# Patient Record
Sex: Female | Born: 1990 | Race: Black or African American | Hispanic: No | Smoking: Never smoker
Health system: Southern US, Community
[De-identification: ages and names within clinical notes are randomized; demographics above are authoritative.]

## PROBLEM LIST (undated history)

## (undated) DIAGNOSIS — G4730 Sleep apnea, unspecified: Secondary | ICD-10-CM

## (undated) DIAGNOSIS — K219 Gastro-esophageal reflux disease without esophagitis: Secondary | ICD-10-CM

## (undated) DIAGNOSIS — K7010 Alcoholic hepatitis without ascites: Secondary | ICD-10-CM

## (undated) DIAGNOSIS — E860 Dehydration: Secondary | ICD-10-CM

## (undated) DIAGNOSIS — F1010 Alcohol abuse, uncomplicated: Secondary | ICD-10-CM

## (undated) DIAGNOSIS — I509 Heart failure, unspecified: Secondary | ICD-10-CM

## (undated) HISTORY — PX: NO PAST SURGERIES: SHX2092

---

## 2013-09-11 ENCOUNTER — Emergency Department (HOSPITAL_BASED_OUTPATIENT_CLINIC_OR_DEPARTMENT_OTHER)
Admission: EM | Admit: 2013-09-11 | Discharge: 2013-09-11 | Disposition: A | Discharge status: Home / Self Care | Payer: Self-pay | Attending: Emergency Medicine | Admitting: Emergency Medicine

## 2013-09-11 ENCOUNTER — Encounter (HOSPITAL_BASED_OUTPATIENT_CLINIC_OR_DEPARTMENT_OTHER): Payer: Self-pay | Admitting: Emergency Medicine

## 2013-09-11 DIAGNOSIS — R209 Unspecified disturbances of skin sensation: Secondary | ICD-10-CM | POA: Insufficient documentation

## 2013-09-11 DIAGNOSIS — Z3202 Encounter for pregnancy test, result negative: Secondary | ICD-10-CM | POA: Insufficient documentation

## 2013-09-11 DIAGNOSIS — R55 Syncope and collapse: Secondary | ICD-10-CM | POA: Insufficient documentation

## 2013-09-11 LAB — BASIC METABOLIC PANEL
ANION GAP: 21 — AB (ref 5–15)
BUN: 5 mg/dL — ABNORMAL LOW (ref 6–23)
CHLORIDE: 98 meq/L (ref 96–112)
CO2: 20 meq/L (ref 19–32)
Calcium: 9.3 mg/dL (ref 8.4–10.5)
Creatinine, Ser: 0.6 mg/dL (ref 0.50–1.10)
GFR calc Af Amer: >90 mL/min (ref >90)
GFR calc non Af Amer: >90 mL/min (ref >90)
Glucose, Bld: 84 mg/dL (ref 70–99)
POTASSIUM: 3.3 meq/L — AB (ref 3.7–5.3)
SODIUM: 139 meq/L (ref 137–147)

## 2013-09-11 LAB — CBC WITH DIFFERENTIAL/PLATELET
BASOS ABS: 0 10*3/uL (ref 0.0–0.1)
Basophils Relative: 0 % (ref 0–1)
Eosinophils Absolute: 0.1 10*3/uL (ref 0.0–0.7)
Eosinophils Relative: 1 % (ref 0–5)
HCT: 36.4 % (ref 36.0–46.0)
Hemoglobin: 12.8 g/dL (ref 12.0–15.0)
LYMPHS ABS: 0.7 10*3/uL (ref 0.7–4.0)
LYMPHS PCT: 10 % — AB (ref 12–46)
MCH: 32.5 pg (ref 26.0–34.0)
MCHC: 35.2 g/dL (ref 30.0–36.0)
MCV: 92.4 fL (ref 78.0–100.0)
Monocytes Absolute: 0.8 10*3/uL (ref 0.1–1.0)
Monocytes Relative: 11 % (ref 3–12)
NEUTROS ABS: 5.2 10*3/uL (ref 1.7–7.7)
Neutrophils Relative %: 78 % — ABNORMAL HIGH (ref 43–77)
PLATELETS: 162 10*3/uL (ref 150–400)
RBC: 3.94 MIL/uL (ref 3.87–5.11)
RDW: 13.8 % (ref 11.5–15.5)
WBC: 6.7 10*3/uL (ref 4.0–10.5)

## 2013-09-11 LAB — URINALYSIS, ROUTINE W REFLEX MICROSCOPIC
BILIRUBIN URINE: NEGATIVE
Glucose, UA: NEGATIVE mg/dL
Hgb urine dipstick: NEGATIVE
Ketones, ur: 40 mg/dL — AB
Nitrite: NEGATIVE
Protein, ur: NEGATIVE mg/dL
Specific Gravity, Urine: 1.022 (ref 1.005–1.030)
UROBILINOGEN UA: 1 mg/dL (ref 0.0–1.0)
pH: 7 (ref 5.0–8.0)

## 2013-09-11 LAB — URINE MICROSCOPIC-ADD ON

## 2013-09-11 LAB — PREGNANCY, URINE: Preg Test, Ur: NEGATIVE

## 2013-09-11 NOTE — ED Notes (Signed)
Pt to room 6 in w/c, able to stand and walk to bed. Pt states she was at work and "I got scared", pt states she started to breath rapidly, her legs felt cramping, she felt lightheaded and "numb and tingly all over". Pt states she is feeling much better now.

## 2013-09-11 NOTE — ED Provider Notes (Signed)
CSN: [id number]     Arrival date & time 09/11/13  4098 History   First MD Initiated Contact with Patient 09/11/13 1010     Chief Complaint  Patient presents with  . Panic Attack     (Consider location/radiation/quality/duration/timing/severity/associated sxs/prior Treatment) HPI Comments: Patient is a 23 year old female who presents with complaints of feeling "numb all over." She went to work this morning feeling well, then suddenly developed tingling around her mouth, difficulty breathing, a tingling sensation throughout her body. She goes though she was going to pass out. This lasted for several minutes, then resolved prior to arrival. She denies any headache, palpitations. She denies any fevers or chills. She states this has never happened to her before. She denies drug or alcohol use.  The history is provided by the patient.    History reviewed. No pertinent past medical history. History reviewed. No pertinent past surgical history. History reviewed. No pertinent family history. History  Substance Use Topics  . Smoking status: Never Smoker   . Smokeless tobacco: Not on file  . Alcohol Use: Not on file   OB History   Grav Para Term Preterm Abortions TAB SAB Ect Mult Living                 Review of Systems  All other systems reviewed and are negative.     Allergies  Review of patient's allergies indicates no known allergies.  Home Medications   Prior to Admission medications   Not on File   BP 144/77  Pulse 113  Temp(Src) 98.3 F (36.8 C) (Oral)  Resp 28  Ht  (1.702 m)  Wt 140 lb (63.504 kg)  BMI 21.92 kg/m2  SpO2 100% Physical Exam  Nursing note and vitals reviewed. Constitutional: She is oriented to person, place, and time. She appears well-developed and well-nourished. No distress.  HENT:  Head: Normocephalic and atraumatic.  Eyes: EOM are normal. Pupils are equal, round, and reactive to light.  Neck: Normal range of motion. Neck supple.   Cardiovascular: Normal rate and regular rhythm.  Exam reveals no gallop and no friction rub.   No murmur heard. Pulmonary/Chest: Effort normal and breath sounds normal. No respiratory distress. She has no wheezes.  Abdominal: Soft. Bowel sounds are normal. She exhibits no distension. There is no tenderness.  Musculoskeletal: Normal range of motion.  Neurological: She is alert and oriented to person, place, and time. No cranial nerve deficit. She exhibits normal muscle tone. Coordination normal.  Skin: Skin is warm and dry. She is not diaphoretic.    ED Course  Procedures (including critical care time) Labs Review Labs Reviewed  CBC WITH DIFFERENTIAL  BASIC METABOLIC PANEL  PREGNANCY, URINE  URINALYSIS, ROUTINE W REFLEX MICROSCOPIC    Imaging Review No results found.   EKG Interpretation   Date/Time:  Monday September 11 2013 10:29:02 EDT Ventricular Rate:  59 PR Interval:  148 QRS Duration: 72 QT Interval:  430 QTC Calculation: 425 R Axis:   89 Text Interpretation:  Sinus bradycardia Otherwise normal ECG Confirmed by  DELOS  MD, Robi Mitter ([id number]) on 09/11/2013 10:30:22 AM      MDM   Final diagnoses:  None    Patient presents with complaints of near syncope, numbness, and tingling. Her initial presentation appeared consistent with a panic attack although she has had no prior history of this. Workup including EKG, laboratory studies, and urinalysis fails to reveal an alternate diagnosis. She is now feeling better and appears appropriate for discharge.  She understands to return if her symptoms worsen or recur.    Geoffery Lyons, MD 09/11/13 [phone]

## 2013-09-11 NOTE — Discharge Instructions (Signed)
Followup with your primary Dr. in the next week, and return to the ER if your symptoms substantially worsen or change.   Near-Syncope Near-syncope (commonly known as near fainting) is sudden weakness, dizziness, or feeling like you might pass out. During an episode of near-syncope, you may also develop pale skin, have tunnel vision, or feel sick to your stomach (nauseous). Near-syncope may occur when getting up after sitting or while standing for a long time. It is caused by a sudden decrease in blood flow to the brain. This decrease can result from various causes or triggers, most of which are not serious. However, because near-syncope can sometimes be a sign of something serious, a medical evaluation is required. The specific cause is often not determined. HOME CARE INSTRUCTIONS  Monitor your condition for any changes. The following actions may help to alleviate any discomfort you are experiencing:  Have someone stay with you until you feel stable.  Lie down right away and prop your feet up if you start feeling like you might faint. Breathe deeply and steadily. Wait until all the symptoms have passed. Most of these episodes last only a few minutes. You may feel tired for several hours.   Drink enough fluids to keep your urine clear or pale yellow.   If you are taking blood pressure or heart medicine, get up slowly when seated or lying down. Take several minutes to sit and then stand. This can reduce dizziness.  Follow up with your health care provider as directed. SEEK IMMEDIATE MEDICAL CARE IF:   You have a severe headache.   You have unusual pain in the chest, abdomen, or back.   You are bleeding from the mouth or rectum, or you have black or tarry stool.   You have an irregular or very fast heartbeat.   You have repeated fainting or have seizure-like jerking during an episode.   You faint when sitting or lying down.   You have confusion.   You have difficulty walking.    You have severe weakness.   You have vision problems.  MAKE SURE YOU:   Understand these instructions.  Will watch your condition.  Will get help right away if you are not doing well or get worse. Document Released: 01/05/2005 Document Revised: 01/10/2013 Document Reviewed: 06/10/2012 [facility] Patient Information 2015 [place], [place]. This information is not intended to replace advice given to you by your health care provider. Make sure you discuss any questions you have with your health care provider.

## 2015-10-16 ENCOUNTER — Encounter (HOSPITAL_BASED_OUTPATIENT_CLINIC_OR_DEPARTMENT_OTHER): Payer: Self-pay | Admitting: Emergency Medicine

## 2015-10-16 ENCOUNTER — Emergency Department (HOSPITAL_BASED_OUTPATIENT_CLINIC_OR_DEPARTMENT_OTHER)
Admission: EM | Admit: 2015-10-16 | Discharge: 2015-10-16 | Disposition: A | Discharge status: Home / Self Care | Payer: Self-pay | Attending: Emergency Medicine | Admitting: Emergency Medicine

## 2015-10-16 DIAGNOSIS — F1010 Alcohol abuse, uncomplicated: Secondary | ICD-10-CM | POA: Insufficient documentation

## 2015-10-16 NOTE — ED Triage Notes (Signed)
Pt tearful at triage requesting alcohol detox. Reports that she consumes approximately 4-5 shots of liquor nightly. States "I drink at night specifically to help me sleep, I have bad insomnia. When I don't sleep I have bad nightmares and anxiety. My mind goes all over the place. Pt reports that called [facility] in [place]. States " I need inpatient"

## 2015-10-16 NOTE — ED Provider Notes (Signed)
[facility] Provider Note   CSN: [id number] Arrival date & time: 10/16/15  1435     History   Chief Complaint Chief Complaint  Patient presents with  . Alcohol Problem    requesting detox    HPI Maureen Hanna is a 25 y.o. female.  HPI  Patient presents here for assistance with alcohol rehabilitation. Patient reports that she drinks every night to help her sleep otherwise she has difficulty sleeping and has nightmares. She endorses increased stress and depression but denies any SI, HI, or AVH. Patient reports already contacting several outpatient rehabilitation facilities.  She denies any other physical complaints at this time.  History reviewed. No pertinent past medical history.  There are no active problems to display for this patient.   History reviewed. No pertinent surgical history.  OB History    No data available       Home Medications    Prior to Admission medications   Not on File    Family History No family history on file.  Social History Social History  Substance Use Topics  . Smoking status: Never Smoker  . Smokeless tobacco: Never Used  . Alcohol use 3.0 oz/week    5 Shots of liquor per week     Allergies   Review of patient's allergies indicates no known allergies.   Review of Systems Review of Systems  Constitutional: Negative for chills and fever.  HENT: Negative for ear pain and sore throat.   Eyes: Negative for pain and visual disturbance.  Respiratory: Negative for cough and shortness of breath.   Cardiovascular: Negative for chest pain and palpitations.  Gastrointestinal: Negative for abdominal pain and vomiting.  Genitourinary: Negative for dysuria and hematuria.  Musculoskeletal: Negative for arthralgias and back pain.  Skin: Negative for color change and rash.  Neurological: Negative for seizures and syncope.  All other systems reviewed and are negative.    Physical Exam Updated Vital Signs BP  115/89 (BP Location: Left Arm)   Pulse 90   Temp 98.1 F (36.7 C) (Oral)   Resp 20   Ht 5\' 7"  (1.702 m)   Wt 132 lb (59.9 kg)   LMP 09/21/2015   SpO2 99%   BMI 20.67 kg/m   Physical Exam  Constitutional: She is oriented to person, place, and time. She appears well-developed and well-nourished. No distress.  HENT:  Head: Normocephalic and atraumatic.  Nose: Nose normal.  Eyes: Conjunctivae and EOM are normal. Pupils are equal, round, and reactive to light. Right eye exhibits no discharge. Left eye exhibits no discharge. No scleral icterus.  Neck: Normal range of motion. Neck supple.  Cardiovascular: Normal rate and regular rhythm.  Exam reveals no gallop and no friction rub.   No murmur heard. Pulmonary/Chest: Effort normal and breath sounds normal. No stridor. No respiratory distress. She has no rales.  Abdominal: Soft. She exhibits no distension. There is no tenderness.  Musculoskeletal: She exhibits no edema or tenderness.  Neurological: She is alert and oriented to person, place, and time.  Skin: Skin is warm and dry. No rash noted. She is not diaphoretic. No erythema.  Psychiatric: She has a normal mood and affect.  Vitals reviewed.    ED Treatments / Results  Labs (all labs ordered are listed, but only abnormal results are displayed) Labs Reviewed - No data to display  EKG  EKG Interpretation  Date/Time:  Wednesday October 16 2015 15:15:25 EDT Ventricular Rate:  70 PR Interval:    QRS Duration:  76 QT Interval:  402 QTC Calculation: 434 R Axis:   81 Text Interpretation:  Sinus arrhythmia new from prior tracing Confirmed by [facility] MD, Patricio Popwell ([id number]) on 10/16/2015 4:47:46 PM       Radiology No results found.  Procedures Procedures (including critical care time)  Medications Ordered in ED Medications - No data to display   Initial Impression / Assessment and Plan / ED Course  I have reviewed the triage vital signs and the nursing notes.  Pertinent  labs & imaging results that were available during my care of the patient were reviewed by me and considered in my medical decision making (see chart for details).  Clinical Course    Patient provided with a additional resources for rehabilitation facilities. No acute or emergent process identified during history of present illness. No indication for workup at this time.  Patient safe for discharge with strict return precautions.  Final Clinical Impressions(s) / ED Diagnoses   Final diagnoses:  Alcohol abuse   Disposition: Discharge  Condition: Good  I have discussed the results, Dx and Tx plan with the patient who expressed understanding and agree(s) with the plan. Discharge instructions discussed at great length. The patient was given strict return precautions who verbalized understanding of the instructions. No further questions at time of discharge.    There are no discharge medications for this patient.   [facility] [address] [place] [id number] [phone] Call  For help establishing care with a care provider       Nira ConnPedro Eduardo Melaina Howerton, MD 10/16/15 [phone]

## 2016-09-17 ENCOUNTER — Emergency Department (HOSPITAL_COMMUNITY): Payer: BC Managed Care – PPO

## 2016-09-17 ENCOUNTER — Encounter (HOSPITAL_BASED_OUTPATIENT_CLINIC_OR_DEPARTMENT_OTHER): Payer: Self-pay

## 2016-09-17 ENCOUNTER — Other Ambulatory Visit: Payer: Self-pay

## 2016-09-17 ENCOUNTER — Emergency Department (HOSPITAL_BASED_OUTPATIENT_CLINIC_OR_DEPARTMENT_OTHER): Payer: BC Managed Care – PPO

## 2016-09-17 ENCOUNTER — Inpatient Hospital Stay (HOSPITAL_BASED_OUTPATIENT_CLINIC_OR_DEPARTMENT_OTHER)
Admission: EM | Admit: 2016-09-17 | Discharge: 2016-09-19 | DRG: 439 | Disposition: A | Discharge status: Home / Self Care | Payer: BC Managed Care – PPO | Attending: Internal Medicine | Admitting: Internal Medicine

## 2016-09-17 DIAGNOSIS — R112 Nausea with vomiting, unspecified: Secondary | ICD-10-CM

## 2016-09-17 DIAGNOSIS — K759 Inflammatory liver disease, unspecified: Secondary | ICD-10-CM

## 2016-09-17 DIAGNOSIS — E8729 Other acidosis: Secondary | ICD-10-CM

## 2016-09-17 DIAGNOSIS — N289 Disorder of kidney and ureter, unspecified: Secondary | ICD-10-CM | POA: Diagnosis present

## 2016-09-17 DIAGNOSIS — E860 Dehydration: Secondary | ICD-10-CM

## 2016-09-17 DIAGNOSIS — E871 Hypo-osmolality and hyponatremia: Secondary | ICD-10-CM | POA: Diagnosis present

## 2016-09-17 DIAGNOSIS — F1010 Alcohol abuse, uncomplicated: Secondary | ICD-10-CM | POA: Diagnosis present

## 2016-09-17 DIAGNOSIS — K859 Acute pancreatitis without necrosis or infection, unspecified: Principal | ICD-10-CM | POA: Diagnosis present

## 2016-09-17 DIAGNOSIS — E876 Hypokalemia: Secondary | ICD-10-CM | POA: Diagnosis present

## 2016-09-17 DIAGNOSIS — R1110 Vomiting, unspecified: Secondary | ICD-10-CM

## 2016-09-17 DIAGNOSIS — E872 Acidosis, unspecified: Secondary | ICD-10-CM | POA: Diagnosis present

## 2016-09-17 DIAGNOSIS — K529 Noninfective gastroenteritis and colitis, unspecified: Secondary | ICD-10-CM | POA: Diagnosis present

## 2016-09-17 DIAGNOSIS — K7010 Alcoholic hepatitis without ascites: Secondary | ICD-10-CM | POA: Diagnosis present

## 2016-09-17 DIAGNOSIS — R748 Abnormal levels of other serum enzymes: Secondary | ICD-10-CM | POA: Diagnosis present

## 2016-09-17 DIAGNOSIS — N390 Urinary tract infection, site not specified: Secondary | ICD-10-CM | POA: Diagnosis present

## 2016-09-17 HISTORY — DX: Alcohol abuse, uncomplicated: F10.10

## 2016-09-17 LAB — URINALYSIS, ROUTINE W REFLEX MICROSCOPIC
Glucose, UA: NEGATIVE mg/dL
Ketones, ur: >80 mg/dL — AB
NITRITE: NEGATIVE
Specific Gravity, Urine: >1.03 — ABNORMAL HIGH (ref 1.005–1.030)
pH: 6 (ref 5.0–8.0)

## 2016-09-17 LAB — URINALYSIS, MICROSCOPIC (REFLEX)

## 2016-09-17 LAB — CBC WITH DIFFERENTIAL/PLATELET
Basophils Absolute: 0 10*3/uL (ref 0.0–0.1)
Basophils Relative: 0 %
Eosinophils Absolute: 0 10*3/uL (ref 0.0–0.7)
Eosinophils Relative: 0 %
HCT: 42.3 % (ref 36.0–46.0)
Hemoglobin: 15.3 g/dL — ABNORMAL HIGH (ref 12.0–15.0)
LYMPHS PCT: 9 %
Lymphs Abs: 0.7 10*3/uL (ref 0.7–4.0)
MCH: 32.8 pg (ref 26.0–34.0)
MCHC: 36.2 g/dL — AB (ref 30.0–36.0)
MCV: 90.8 fL (ref 78.0–100.0)
MONO ABS: 1.2 10*3/uL — AB (ref 0.1–1.0)
MONOS PCT: 15 %
Neutro Abs: 5.9 10*3/uL (ref 1.7–7.7)
Neutrophils Relative %: 76 %
Platelets: 140 10*3/uL — ABNORMAL LOW (ref 150–400)
RBC: 4.66 MIL/uL (ref 3.87–5.11)
RDW: 14.1 % (ref 11.5–15.5)
WBC: 7.8 10*3/uL (ref 4.0–10.5)

## 2016-09-17 LAB — COMPREHENSIVE METABOLIC PANEL
ALBUMIN: 4.8 g/dL (ref 3.5–5.0)
ALT: 102 U/L — ABNORMAL HIGH (ref 14–54)
ANION GAP: 29 — AB (ref 5–15)
AST: 353 U/L — AB (ref 15–41)
Alkaline Phosphatase: 94 U/L (ref 38–126)
BUN: 26 mg/dL — ABNORMAL HIGH (ref 6–20)
CALCIUM: 9.4 mg/dL (ref 8.9–10.3)
CO2: 12 mmol/L — AB (ref 22–32)
Chloride: 89 mmol/L — ABNORMAL LOW (ref 101–111)
Creatinine, Ser: 1.13 mg/dL — ABNORMAL HIGH (ref 0.44–1.00)
GFR calc Af Amer: >60 mL/min (ref >60)
GFR calc non Af Amer: >60 mL/min (ref >60)
GLUCOSE: 96 mg/dL (ref 65–99)
Potassium: 3.3 mmol/L — ABNORMAL LOW (ref 3.5–5.1)
SODIUM: 130 mmol/L — AB (ref 135–145)
Total Bilirubin: 3.3 mg/dL — ABNORMAL HIGH (ref 0.3–1.2)
Total Protein: 8.8 g/dL — ABNORMAL HIGH (ref 6.5–8.1)

## 2016-09-17 LAB — PREGNANCY, URINE: Preg Test, Ur: NEGATIVE

## 2016-09-17 LAB — I-STAT CG4 LACTIC ACID, ED
Lactic Acid, Venous: 1.68 mmol/L (ref 0.5–1.9)
Lactic Acid, Venous: 4.68 mmol/L (ref 0.5–1.9)

## 2016-09-17 LAB — LIPASE, BLOOD: LIPASE: 315 U/L — AB (ref 11–51)

## 2016-09-17 MED ORDER — DEXTROSE 5 % IV SOLN
1.0000 g | Freq: Once | INTRAVENOUS | Status: AC
  Administered 2016-09-17: 1 g via INTRAVENOUS
  Filled 2016-09-17: qty 10

## 2016-09-17 MED ORDER — PROMETHAZINE HCL 25 MG/ML IJ SOLN
12.5000 mg | Freq: Once | INTRAMUSCULAR | Status: AC
  Administered 2016-09-17: 12.5 mg via INTRAVENOUS
  Filled 2016-09-17: qty 1

## 2016-09-17 MED ORDER — IOPAMIDOL (ISOVUE-300) INJECTION 61%
100.0000 mL | Freq: Once | INTRAVENOUS | Status: AC | PRN
  Administered 2016-09-17: 100 mL via INTRAVENOUS

## 2016-09-17 MED ORDER — ONDANSETRON HCL 4 MG/2ML IJ SOLN
4.0000 mg | Freq: Once | INTRAMUSCULAR | Status: AC
  Administered 2016-09-17: 4 mg via INTRAVENOUS
  Filled 2016-09-17: qty 2

## 2016-09-17 MED ORDER — SODIUM CHLORIDE 0.9 % IV BOLUS (SEPSIS)
1000.0000 mL | Freq: Once | INTRAVENOUS | Status: AC
  Administered 2016-09-17: 1000 mL via INTRAVENOUS

## 2016-09-17 MED ORDER — SODIUM CHLORIDE 0.9 % IV BOLUS (SEPSIS)
2000.0000 mL | Freq: Once | INTRAVENOUS | Status: AC
  Administered 2016-09-17: 1000 mL via INTRAVENOUS

## 2016-09-17 NOTE — ED Provider Notes (Signed)
Emergency Department Provider Note   I have reviewed the triage vital signs and the nursing notes.   HISTORY  Chief Complaint Vomiting   HPI Maureen Hanna is a 26 y.o. female without significant past medical history who presents to the emergency department today secondary to 4 days of vomiting. She states that she ate at a friend's house on Sunday and then that evening started having vomiting. She thought it was just food poisoning versus viral gastroenteritis like she had had before and kind of dealt with it at home with her antibiotics. She continued with vomiting for the last 4 days total. Nonbloody nonbilious. No diarrhea. She has had some dysuria and now also is having bilateral low back pain. Never had pyelonephritis before but has had a UTI that felt similar. No other modifying or associated symptoms. Besides antiemetics is not driving for the symptoms. She states she did start her period a couple days ago but she usually does not have these types of symptoms with her period.    Past Medical History:  Diagnosis Date  . Alcohol abuse     There are no active problems to display for this patient.   History reviewed. No pertinent surgical history.    Allergies Patient has no known allergies.  No family history on file.  Social History Social History  Substance Use Topics  . Smoking status: Never Smoker  . Smokeless tobacco: Never Used  . Alcohol use Yes     Comment: weekly    Review of Systems Constitutional: No fever but has had some chills Eyes: No visual changes. ENT: No sore throat. Cardiovascular: Denies chest pain. Respiratory: Denies shortness of breath. Gastrointestinal: Mild abdominal pain.  No diarrhea.  No constipation. Genitourinary: Negative for dysuria. Musculoskeletal: Positive for back pain. Skin: Negative for rash. Neurological: Negative for headaches, focal weakness or numbness.  10-point ROS otherwise  negative.  ____________________________________________   PHYSICAL EXAM:  VITAL SIGNS: ED Triage Vitals [09/17/16 1933]  Enc Vitals Group     BP (!) 123/91     Pulse Rate (!) 148     Resp 20     Temp 98.8 F (37.1 C)     Temp Source Oral     SpO2 100 %     Weight 113 lb (51.3 kg)     Height 5\' 7"  (1.702 m)     Head Circumference      Peak Flow      Pain Score      Pain Loc      Pain Edu?      Excl. in GC?     Constitutional: Alert and oriented. Well appearing and in no acute distress. Eyes: Conjunctivae are normal. PERRL. EOMI. Head: Atraumatic. Nose: No congestion/rhinnorhea. Mouth/Throat: Mucous membranes are moist.  Oropharynx non-erythematous. Neck: No stridor.  No meningeal signs.   Cardiovascular: Tachycardia, regular rhythm. Good peripheral circulation. Grossly normal heart sounds.   Respiratory: Normal respiratory effort.  No retractions. Lungs CTAB. Gastrointestinal: mild epigastric and suprapubic tenderness. No distention.  Musculoskeletal: No lower extremity tenderness nor edema. No gross deformities of extremities. Neurologic:  Normal speech and language. No gross focal neurologic deficits are appreciated.  Skin:  Skin is warm, dry and intact. No rash noted.   ____________________________________________   LABS (all labs ordered are listed, but only abnormal results are displayed)  Labs Reviewed  COMPREHENSIVE METABOLIC PANEL - Abnormal; Notable for the following:       Result Value   Sodium 130 (*)  Potassium 3.3 (*)    Chloride 89 (*)    CO2 12 (*)    BUN 26 (*)    Creatinine, Ser 1.13 (*)    Total Protein 8.8 (*)    AST 353 (*)    ALT 102 (*)    Total Bilirubin 3.3 (*)    Anion gap 29 (*)    All other components within normal limits  CBC WITH DIFFERENTIAL/PLATELET - Abnormal; Notable for the following:    Hemoglobin 15.3 (*)    MCHC 36.2 (*)    Platelets 140 (*)    Monocytes Absolute 1.2 (*)    All other components within normal  limits  URINALYSIS, ROUTINE W REFLEX MICROSCOPIC - Abnormal; Notable for the following:    Color, Urine RED (*)    APPearance TURBID (*)    Glucose, UA   (*)    Value: TEST NOT REPORTED DUE TO COLOR INTERFERENCE OF URINE PIGMENT   Hgb urine dipstick   (*)    Value: TEST NOT REPORTED DUE TO COLOR INTERFERENCE OF URINE PIGMENT   Bilirubin Urine   (*)    Value: TEST NOT REPORTED DUE TO COLOR INTERFERENCE OF URINE PIGMENT   Ketones, ur   (*)    Value: TEST NOT REPORTED DUE TO COLOR INTERFERENCE OF URINE PIGMENT   Protein, ur   (*)    Value: TEST NOT REPORTED DUE TO COLOR INTERFERENCE OF URINE PIGMENT   Nitrite   (*)    Value: TEST NOT REPORTED DUE TO COLOR INTERFERENCE OF URINE PIGMENT   Leukocytes, UA   (*)    Value: TEST NOT REPORTED DUE TO COLOR INTERFERENCE OF URINE PIGMENT   All other components within normal limits  URINALYSIS, MICROSCOPIC (REFLEX) - Abnormal; Notable for the following:    Bacteria, UA MANY (*)    Squamous Epithelial / LPF 6-30 (*)    All other components within normal limits  URINALYSIS, ROUTINE W REFLEX MICROSCOPIC - Abnormal; Notable for the following:    APPearance CLOUDY (*)    Specific Gravity, Urine >1.030 (*)    Hgb urine dipstick LARGE (*)    Bilirubin Urine MODERATE (*)    Ketones, ur >80 (*)    Protein, ur >300 (*)    Leukocytes, UA TRACE (*)    All other components within normal limits  LIPASE, BLOOD - Abnormal; Notable for the following:    Lipase 315 (*)    All other components within normal limits  URINALYSIS, MICROSCOPIC (REFLEX) - Abnormal; Notable for the following:    Bacteria, UA FEW (*)    Squamous Epithelial / LPF 0-5 (*)    All other components within normal limits  BASIC METABOLIC PANEL - Abnormal; Notable for the following:    Sodium 126 (*)    Potassium 3.3 (*)    Chloride 92 (*)    CO2 12 (*)    Calcium 7.8 (*)    Anion gap 22 (*)    All other components within normal limits  I-STAT CG4 LACTIC ACID, ED - Abnormal; Notable  for the following:    Lactic Acid, Venous 4.68 (*)    All other components within normal limits  PREGNANCY, URINE  LACTIC ACID, PLASMA  LACTIC ACID, PLASMA  HEPATITIS PANEL, ACUTE  I-STAT CG4 LACTIC ACID, ED   ____________________________________________  EKG   EKG Interpretation  Date/Time:    Ventricular Rate:    PR Interval:    QRS Duration:   QT Interval:  QTC Calculation:   R Axis:     Text Interpretation:         ____________________________________________  RADIOLOGY  Dg Chest 2 View  Result Date: 09/17/2016 CLINICAL DATA:  Four-day history of cough, shortness of breath on exertion, nausea and vomiting, and dysuria. EXAM: CHEST  2 VIEW COMPARISON:  None. FINDINGS: Cardiomediastinal silhouette unremarkable. Lungs clear. Bronchovascular markings normal. Pulmonary vascularity normal. No visible pleural effusions. No pneumothorax. Slight thoracic dextroscoliosis. IMPRESSION: No acute cardiopulmonary disease. Electronically Signed   By: Hulan Saas M.D.   On: 09/17/2016 20:06   Ct Abdomen Pelvis W Contrast  Result Date: 09/18/2016 CLINICAL DATA:  Nausea vomiting EXAM: CT ABDOMEN AND PELVIS WITH CONTRAST TECHNIQUE: Multidetector CT imaging of the abdomen and pelvis was performed using the standard protocol following bolus administration of intravenous contrast. CONTRAST:  ISOVUE-300 IOPAMIDOL (ISOVUE-300) INJECTION 61% COMPARISON:  09/17/2016 FINDINGS: Lower chest: Lung bases demonstrate no acute consolidation or pleural effusion. Normal heart size. Hepatobiliary: Enlarged fatty liver measuring up to 20 cm. No focal abnormality. No calcified gallstone or biliary dilatation Pancreas: Unremarkable. No pancreatic ductal dilatation or surrounding inflammatory changes. Spleen: Normal in size without focal abnormality. Adrenals/Urinary Tract: Adrenal glands are unremarkable. Kidneys are normal, without renal calculi, focal lesion, or hydronephrosis. Bladder is  unremarkable. Stomach/Bowel: Stomach is nonenlarged. No dilated small bowel. Suspected mild wall thickening of the ascending colon and proximal transverse colon. Small amount of fluid in the right paracolic gutter. Appendix within normal limits. Vascular/Lymphatic: No significant vascular findings are present. No enlarged abdominal or pelvic lymph nodes. Reproductive: Uterus is unremarkable. 2.6 cm intermediate density lesion left adnexa. Focal water density in the right pelvis which appears contiguous with a tubular structure, suspect that this is a hydrosalpinx, dilated tube measures up to 14 mm. 4 cm possible cyst in the right ovary. Other: Small free fluid in the pelvis.  Negative for free air. Musculoskeletal: No acute or significant osseous findings. IMPRESSION: 1. Suspicion of mild colon wall thickening/colitis of the ascending and transverse colon. Negative appendix. 2. Enlarged fatty liver 3. Right-sided hydrosalpinx. Intermediate density left adnexal lesion which may be correlated with a nonemergent pelvic ultrasound. 4. Small amount of free fluid in the pelvis. Electronically Signed   By: Jasmine Pang M.D.   On: 09/18/2016 00:05   US Abdomen Limited Ruq  Result Date: 09/17/2016 CLINICAL DATA:  Vomiting and diarrhea for 4 days. EXAM: ULTRASOUND ABDOMEN LIMITED RIGHT UPPER QUADRANT COMPARISON:  None. FINDINGS: Gallbladder: No gallstones or wall thickening visualized. No sonographic Murphy sign noted by sonographer. Common bile duct: Diameter: 2.4 mm Liver: Generalized coarsening of hepatic parenchymal echotexture without discrete lesion. Portal vein is patent on color Doppler imaging with normal direction of blood flow towards the liver. IMPRESSION: Normal gallbladder and bile ducts. Coarsened hepatic parenchymal echotexture may represent fatty infiltration. Electronically Signed   By: Ellery Plunk M.D.   On: 09/17/2016 22:43     ____________________________________________   PROCEDURES  Procedure(s) performed:   Procedures  ____________________________________________   INITIAL IMPRESSION / ASSESSMENT AND PLAN / ED COURSE  Pertinent labs & imaging results that were available during my care of the patient were reviewed by me and considered in my medical decision making (see chart for details).  Suspect possible pyelonephritis but significantly contaminated sample secondary to her vaginal bleeding. So we will do a catheterization to get a cleaner sample. In the meantime I will start fluid hydration for her dehydration. I will also order some antiemetics.  Elevated liver  enzymes and bilirubin so ultrasound was checked for cholecystitis was negative. Patient still having some discomfort. Her labs show very significant acidosis likely secondary to her dehydration. Lactic acid elevated. Will rehydrate and get CT scan.  CT scan with evidence of colitis so I will give Cipro Flagyl along with the Rocephin that was already given. Repeat lactic acid was much improved however her anion gap acidosis has not improved much. She became slightly more hyponatremic which is odd since she had 3 L of fluid. Her heart rate improved to 110 or so and overall she feels improved but still feels weak and not well when she stands up. Still having nausea after 2 doses of nausea medicine. Lipase slightly elevated but pancreas was normal and the CT scan I doubt pancreatitis. Unsure of the cause of her symptoms however it is not improving well enough for me to discharge her home so will talk to the hospitalist about admission. ____________________________________________  FINAL CLINICAL IMPRESSION(S) / ED DIAGNOSES  Final diagnoses:  Vomiting  High anion gap metabolic acidosis  Colitis  Hyponatremia  Lactic acidosis  Dehydration  Hepatitis    MEDICATIONS GIVEN DURING THIS VISIT:  Medications  metoCLOPramide (REGLAN) injection  10 mg (not administered)  ciprofloxacin (CIPRO) IVPB 400 mg (not administered)  metroNIDAZOLE (FLAGYL) IVPB 500 mg (not administered)  sodium chloride 0.9 % bolus 2,000 mL (0 mLs Intravenous Stopped 09/17/16 2320)  ondansetron (ZOFRAN) injection 4 mg (4 mg Intravenous Given 09/17/16 2046)  sodium chloride 0.9 % bolus 1,000 mL (1,000 mLs Intravenous New Bag/Given 09/17/16 2259)  cefTRIAXone (ROCEPHIN) 1 g in dextrose 5 % 50 mL IVPB (0 g Intravenous Stopped 09/18/16 0030)  promethazine (PHENERGAN) injection 12.5 mg (12.5 mg Intravenous Given 09/17/16 2320)  iopamidol (ISOVUE-300) 61 % injection 100 mL (100 mLs Intravenous Contrast Given 09/17/16 2329)    Note:  This document was prepared using Dragon voice recognition software and may include unintentional dictation errors.    Makenli Derstine, Barbara CowerJason, MD 09/18/16 [phone]

## 2016-09-17 NOTE — ED Notes (Signed)
Patient transported to Ultrasound 

## 2016-09-17 NOTE — ED Triage Notes (Signed)
C/o n/v x 4 days-burning after urination, "tightness" to both feet- x 2-3 days-NAD-steady gait

## 2016-09-17 NOTE — ED Notes (Signed)
Pt with DOE and feeling like she was going to pass out after going to [facility]-pt is menstruating-bloody urine for CCUA

## 2016-09-18 ENCOUNTER — Encounter (HOSPITAL_COMMUNITY): Payer: Self-pay | Admitting: Internal Medicine

## 2016-09-18 DIAGNOSIS — K529 Noninfective gastroenteritis and colitis, unspecified: Secondary | ICD-10-CM | POA: Diagnosis present

## 2016-09-18 DIAGNOSIS — E872 Acidosis, unspecified: Secondary | ICD-10-CM | POA: Diagnosis present

## 2016-09-18 DIAGNOSIS — E860 Dehydration: Secondary | ICD-10-CM | POA: Diagnosis present

## 2016-09-18 DIAGNOSIS — R112 Nausea with vomiting, unspecified: Secondary | ICD-10-CM

## 2016-09-18 DIAGNOSIS — F1010 Alcohol abuse, uncomplicated: Secondary | ICD-10-CM | POA: Diagnosis present

## 2016-09-18 DIAGNOSIS — K759 Inflammatory liver disease, unspecified: Secondary | ICD-10-CM | POA: Diagnosis not present

## 2016-09-18 DIAGNOSIS — K859 Acute pancreatitis without necrosis or infection, unspecified: Secondary | ICD-10-CM | POA: Diagnosis present

## 2016-09-18 DIAGNOSIS — R748 Abnormal levels of other serum enzymes: Secondary | ICD-10-CM

## 2016-09-18 DIAGNOSIS — E871 Hypo-osmolality and hyponatremia: Secondary | ICD-10-CM | POA: Diagnosis present

## 2016-09-18 DIAGNOSIS — E876 Hypokalemia: Secondary | ICD-10-CM | POA: Diagnosis present

## 2016-09-18 DIAGNOSIS — N390 Urinary tract infection, site not specified: Secondary | ICD-10-CM | POA: Diagnosis present

## 2016-09-18 DIAGNOSIS — K7010 Alcoholic hepatitis without ascites: Secondary | ICD-10-CM | POA: Diagnosis present

## 2016-09-18 DIAGNOSIS — N289 Disorder of kidney and ureter, unspecified: Secondary | ICD-10-CM | POA: Diagnosis present

## 2016-09-18 LAB — GAMMA GT: GGT: 1013 U/L — ABNORMAL HIGH (ref 7–50)

## 2016-09-18 LAB — SALICYLATE LEVEL

## 2016-09-18 LAB — COMPREHENSIVE METABOLIC PANEL
ALBUMIN: 3.3 g/dL — AB (ref 3.5–5.0)
ALT: 69 U/L — ABNORMAL HIGH (ref 14–54)
ANION GAP: 15 (ref 5–15)
AST: 260 U/L — ABNORMAL HIGH (ref 15–41)
Alkaline Phosphatase: 67 U/L (ref 38–126)
BUN: 13 mg/dL (ref 6–20)
CHLORIDE: 103 mmol/L (ref 101–111)
CO2: 16 mmol/L — AB (ref 22–32)
Calcium: 7.7 mg/dL — ABNORMAL LOW (ref 8.9–10.3)
Creatinine, Ser: 1.12 mg/dL — ABNORMAL HIGH (ref 0.44–1.00)
GFR calc Af Amer: >60 mL/min (ref >60)
GFR calc non Af Amer: >60 mL/min (ref >60)
GLUCOSE: 62 mg/dL — AB (ref 65–99)
POTASSIUM: 3.3 mmol/L — AB (ref 3.5–5.1)
Sodium: 134 mmol/L — ABNORMAL LOW (ref 135–145)
TOTAL PROTEIN: 6.2 g/dL — AB (ref 6.5–8.1)
Total Bilirubin: 2.8 mg/dL — ABNORMAL HIGH (ref 0.3–1.2)

## 2016-09-18 LAB — BASIC METABOLIC PANEL
Anion gap: 22 — ABNORMAL HIGH (ref 5–15)
BUN: 20 mg/dL (ref 6–20)
CHLORIDE: 92 mmol/L — AB (ref 101–111)
CO2: 12 mmol/L — AB (ref 22–32)
Calcium: 7.8 mg/dL — ABNORMAL LOW (ref 8.9–10.3)
Creatinine, Ser: 0.95 mg/dL (ref 0.44–1.00)
GFR calc non Af Amer: >60 mL/min (ref >60)
Glucose, Bld: 75 mg/dL (ref 65–99)
POTASSIUM: 3.3 mmol/L — AB (ref 3.5–5.1)
SODIUM: 126 mmol/L — AB (ref 135–145)

## 2016-09-18 LAB — CBC
HEMATOCRIT: 31.7 % — AB (ref 36.0–46.0)
HEMOGLOBIN: 11.3 g/dL — AB (ref 12.0–15.0)
MCH: 32.3 pg (ref 26.0–34.0)
MCHC: 35.6 g/dL (ref 30.0–36.0)
MCV: 90.6 fL (ref 78.0–100.0)
Platelets: 98 10*3/uL — ABNORMAL LOW (ref 150–400)
RBC: 3.5 MIL/uL — ABNORMAL LOW (ref 3.87–5.11)
RDW: 14.3 % (ref 11.5–15.5)
WBC: 7.5 10*3/uL (ref 4.0–10.5)

## 2016-09-18 LAB — MAGNESIUM: MAGNESIUM: 1.4 mg/dL — AB (ref 1.7–2.4)

## 2016-09-18 LAB — HIV ANTIBODY (ROUTINE TESTING W REFLEX): HIV SCREEN 4TH GENERATION: NONREACTIVE

## 2016-09-18 LAB — ACETAMINOPHEN LEVEL

## 2016-09-18 LAB — I-STAT CG4 LACTIC ACID, ED: LACTIC ACID, VENOUS: 1.23 mmol/L (ref 0.5–1.9)

## 2016-09-18 LAB — ETHANOL

## 2016-09-18 MED ORDER — SODIUM CHLORIDE 0.9 % IV BOLUS (SEPSIS)
1000.0000 mL | Freq: Once | INTRAVENOUS | Status: AC
  Administered 2016-09-18: 1000 mL via INTRAVENOUS

## 2016-09-18 MED ORDER — ONDANSETRON HCL 4 MG PO TABS: 4.0000 mg | ORAL_TABLET | Freq: Four times a day (QID) | ORAL | Status: DC | PRN

## 2016-09-18 MED ORDER — PROMETHAZINE HCL 25 MG/ML IJ SOLN
12.5000 mg | Freq: Four times a day (QID) | INTRAMUSCULAR | Status: DC | PRN
  Administered 2016-09-18 (×2): 12.5 mg via INTRAVENOUS
  Filled 2016-09-18 (×3): qty 1

## 2016-09-18 MED ORDER — ONDANSETRON HCL 4 MG/2ML IJ SOLN: 4.0000 mg | Freq: Four times a day (QID) | INTRAMUSCULAR | Status: DC | PRN

## 2016-09-18 MED ORDER — POTASSIUM CHLORIDE 10 MEQ/100ML IV SOLN
10.0000 meq | INTRAVENOUS | Status: AC
  Administered 2016-09-18 (×3): 10 meq via INTRAVENOUS
  Filled 2016-09-18 (×3): qty 100

## 2016-09-18 MED ORDER — THIAMINE HCL 100 MG/ML IJ SOLN: 100.0000 mg | Freq: Every day | INTRAMUSCULAR | Status: DC

## 2016-09-18 MED ORDER — ENSURE ENLIVE PO LIQD
237.0000 mL | Freq: Three times a day (TID) | ORAL | Status: DC
  Administered 2016-09-18 – 2016-09-19 (×2): 237 mL via ORAL

## 2016-09-18 MED ORDER — ADULT MULTIVITAMIN W/MINERALS CH
1.0000 | ORAL_TABLET | Freq: Every day | ORAL | Status: DC
  Administered 2016-09-18 – 2016-09-19 (×2): 1 via ORAL
  Filled 2016-09-18 (×2): qty 1

## 2016-09-18 MED ORDER — FOLIC ACID 1 MG PO TABS
1.0000 mg | ORAL_TABLET | Freq: Every day | ORAL | Status: DC
  Administered 2016-09-18 – 2016-09-19 (×2): 1 mg via ORAL
  Filled 2016-09-18 (×2): qty 1

## 2016-09-18 MED ORDER — LORAZEPAM 1 MG PO TABS: 1.0000 mg | ORAL_TABLET | Freq: Four times a day (QID) | ORAL | Status: DC | PRN

## 2016-09-18 MED ORDER — VITAMIN B-1 100 MG PO TABS
100.0000 mg | ORAL_TABLET | Freq: Every day | ORAL | Status: DC
  Administered 2016-09-18 – 2016-09-19 (×2): 100 mg via ORAL
  Filled 2016-09-18 (×2): qty 1

## 2016-09-18 MED ORDER — ALBUTEROL SULFATE (2.5 MG/3ML) 0.083% IN NEBU: 2.5000 mg | INHALATION_SOLUTION | RESPIRATORY_TRACT | Status: DC | PRN

## 2016-09-18 MED ORDER — METRONIDAZOLE IN NACL 5-0.79 MG/ML-% IV SOLN
500.0000 mg | Freq: Once | INTRAVENOUS | Status: AC
  Administered 2016-09-18: 500 mg via INTRAVENOUS

## 2016-09-18 MED ORDER — CIPROFLOXACIN IN D5W 400 MG/200ML IV SOLN
400.0000 mg | Freq: Two times a day (BID) | INTRAVENOUS | Status: DC
  Administered 2016-09-18 – 2016-09-19 (×3): 400 mg via INTRAVENOUS
  Filled 2016-09-18 (×3): qty 200

## 2016-09-18 MED ORDER — METRONIDAZOLE IN NACL 5-0.79 MG/ML-% IV SOLN
500.0000 mg | Freq: Three times a day (TID) | INTRAVENOUS | Status: DC
  Administered 2016-09-18 – 2016-09-19 (×4): 500 mg via INTRAVENOUS
  Filled 2016-09-18 (×4): qty 100

## 2016-09-18 MED ORDER — DIPHENHYDRAMINE HCL 25 MG PO CAPS
25.0000 mg | ORAL_CAPSULE | Freq: Once | ORAL | Status: AC
  Administered 2016-09-18: 25 mg via ORAL
  Filled 2016-09-18: qty 1

## 2016-09-18 MED ORDER — METOCLOPRAMIDE HCL 5 MG/ML IJ SOLN
10.0000 mg | Freq: Once | INTRAMUSCULAR | Status: AC
  Administered 2016-09-18: 10 mg via INTRAVENOUS
  Filled 2016-09-18: qty 2

## 2016-09-18 MED ORDER — CIPROFLOXACIN IN D5W 400 MG/200ML IV SOLN
400.0000 mg | Freq: Once | INTRAVENOUS | Status: AC
Start: 2016-09-18 — End: 2016-09-18
  Administered 2016-09-18: 400 mg via INTRAVENOUS
  Filled 2016-09-18: qty 200

## 2016-09-18 MED ORDER — ENOXAPARIN SODIUM 40 MG/0.4ML ~~LOC~~ SOLN
40.0000 mg | SUBCUTANEOUS | Status: DC
  Filled 2016-09-18 (×2): qty 0.4

## 2016-09-18 MED ORDER — LORAZEPAM 2 MG/ML IJ SOLN: 1.0000 mg | Freq: Four times a day (QID) | INTRAMUSCULAR | Status: DC | PRN

## 2016-09-18 MED ORDER — LORAZEPAM 2 MG/ML IJ SOLN
0.0000 mg | Freq: Two times a day (BID) | INTRAMUSCULAR | Status: DC
Start: 2016-09-20 — End: 2016-09-19

## 2016-09-18 MED ORDER — MAGNESIUM SULFATE 2 GM/50ML IV SOLN
2.0000 g | Freq: Once | INTRAVENOUS | Status: AC
  Administered 2016-09-18: 2 g via INTRAVENOUS
  Filled 2016-09-18: qty 50

## 2016-09-18 MED ORDER — PANTOPRAZOLE SODIUM 40 MG IV SOLR
40.0000 mg | Freq: Two times a day (BID) | INTRAVENOUS | Status: DC
  Administered 2016-09-18 – 2016-09-19 (×4): 40 mg via INTRAVENOUS
  Filled 2016-09-18 (×4): qty 40

## 2016-09-18 MED ORDER — LORAZEPAM 2 MG/ML IJ SOLN: 0.0000 mg | Freq: Four times a day (QID) | INTRAMUSCULAR | Status: DC

## 2016-09-18 NOTE — Progress Notes (Signed)
[facility] Ambulated from stretcher to bed. Pt identified appropriately. Pt alert and oriented x 4, VS stable, no signs of acute distress. Cardiac monitor placed on pt, pt oriented to room and equipment, advised about valuable policy. Instructed to call for assistance and call bell left within reach. Admission paged and made aware of pt arrival.  Call back received, MD will come to assess pt.  Will continue to monitor and treat pt per MD orders.

## 2016-09-18 NOTE — Progress Notes (Signed)
Maureen Hanna is a 26 y.o. female with medical history significant of alcohol abuse; presents with at least a four-day history of nausea and vomiting symptoms. Pt admitted earlier this am by Dr Katrinka Blazingsmith , please see his note for detailed H&P.   Plan:  Gentle hydration.  Follow up liver function tests.  Continue with IV antibiotics for the mild colitis.  Symptomatic management for the symptoms.    Maureen Modyvijaya Matvey Llanas, MD [phone]

## 2016-09-18 NOTE — Progress Notes (Signed)
Initial Nutrition Assessment  DOCUMENTATION CODES:   Underweight  INTERVENTION:   -Ensure Enlive po TID, each supplement provides 350 kcal and 20 grams of protein  NUTRITION DIAGNOSIS:   Inadequate oral intake related to altered GI function as evidenced by meal completion < 50%, per patient/family report.  GOAL:   Patient will meet greater than or equal to 90% of their needs  MONITOR:   PO intake, Supplement acceptance, Diet advancement, Labs, Weight trends, Skin, I & O's  REASON FOR ASSESSMENT:   Malnutrition Screening Tool    ASSESSMENT:   Maureen Hanna is a 26 y.o. female with medical history significant of alcohol abuse; presents with at least a four-day history of nausea and vomiting symptoms. Patient reports symptoms started approximately 4 hours after eating a meal prepared by one of her friends. She reports having persistent nausea and vomiting unable to keep any food or liquids down  Pt admitted with acute colitis.   Spoke with pt, who reports poor oral intake over the past 5 days, due to inability to keep food and liquids down. She typically has a good appetite, consuming 2-3 meals per day (Breakfast: protein shake and banana, Lunch: rice or seafood and vegetables, Dinner: seafood and vegetables). She is currently tolerating liquids without difficulty, however, desires to try soild foods. Pt consumed about 40% of full liquid lunch tray.   Pt reports she has been small framed all of her life, as are most of her family members. Her UBW is around 130#, revealing the most she has ever weighed was 140#. Pt shares that she has lost about 8# over the past 5 days, however, no wt hx available to confirm. Noted 14.3% wt loss over the past 11 months, which is not significant. Pt reports weight loss is related to healthy eating (transitioned to a pescatarian diet) and working out more frequently.   Nutrition-Focused physical exam completed. Findings are no fat depletion, no  muscle depletion, and no edema.   Discussed importance of good meal intake to promote healing and ways to increase protein intake in diet. Pt requesting Ensure supplements, which RD will order.   Medications reviewed and include folvite, thiamine, and MVI.   Labs reviewed: Na: 134, K: 3.3, Mg: 1.4.   Diet Order:  Diet full liquid Room service appropriate? Yes; Fluid consistency: Thin  Skin:  Reviewed, no issues  Last BM:  09/17/16  Height:   Ht Readings from Last 1 Encounters:  09/17/16 5\' 7"  (1.702 m)    Weight:   Wt Readings from Last 1 Encounters:  09/17/16 113 lb (51.3 kg)    Ideal Body Weight:  61.4 kg  BMI:  Body mass index is 17.7 kg/m.  Estimated Nutritional Needs:   Kcal:  1500-1700  Protein:  70-85 grams  Fluid:  1.5-1.7 L  EDUCATION NEEDS:   Education needs addressed  Maureen Hanna, RD, LDN, CDE Pager: [phone] After hours Pager: [phone]

## 2016-09-18 NOTE — H&P (Signed)
History and Physical    Maureen Hanna:[id number]:[id number] DOB: 1990-07-14 DOA: 09/17/2016  Referring MD/NP/PA: Dr. Erin Hanna PCP: Patient, No Pcp Per  [facility]  Chief Complaint: Nausea and vomiting  HPI: Maureen LeylandShakira Hanna is a 26 y.o. female with medical history significant of alcohol abuse; presents with at least a four-day history of nausea and vomiting symptoms. Patient reports symptoms started approximately 4 hours after eating a meal prepared by one of her friends. She reports having persistent nausea and vomiting unable to keep any food or liquids down. Emesis was reported to be of stomach contents and nonbloody. She reports having some mild abdominal cramps that she relates to her menstrual cycle which started 2 days ago. Associated symptoms include fatigue, generalized weakness, mild complaints of diarrhea that stopped at least 2 days ago. She reports usually drinking 5-6 shots usually on the weekends and does not drink during the week. Denies ever suffering from withdrawals from use of alcohol. Review of records shows the patient had been seen in the emergency department previously for alcoholism back in 10/16/2015 seeking treatment. Denies any illicit drug use or thoughts of wanting to hurt herself.  ED Course: On admission to the emergency department patient was seen to be afebrile, pulse 91-148, respirations 16-20, and other vital signs maintained. Labs revealed WBC 7.8, hemoglobin 15.3, platelets 140, lactic acid 4.68 , sodium 130, potassium 3.3, CO2 12,, BUN 26, creatinine 1.13, anion gap 29, lipase 328, AST 356, ALT 102, total bilirubin 3.3..  Review of Systems: Review of Systems  Constitutional: Positive for malaise/fatigue. Negative for chills and fever.  HENT: Negative for ear discharge and nosebleeds.   Eyes: Negative for photophobia and pain.  Respiratory: Negative for cough and sputum production.   Cardiovascular: Negative for chest pain,  palpitations and leg swelling.  Gastrointestinal: Positive for nausea and vomiting. Negative for blood in stool and constipation.  Genitourinary: Positive for frequency. Negative for hematuria.       On menstrual cycle  Musculoskeletal: Positive for myalgias. Negative for falls.  Skin: Negative for itching and rash.  Neurological: Positive for weakness. Negative for focal weakness and loss of consciousness.  Endo/Heme/Allergies: Negative for polydipsia. Does not bruise/bleed easily.  Psychiatric/Behavioral: Positive for substance abuse. Negative for suicidal ideas. The patient is not nervous/anxious.     Past Medical History:  Diagnosis Date  . Alcohol abuse     History reviewed. No pertinent surgical history.   reports that she has never smoked. She has never used smokeless tobacco. She reports that she drinks alcohol. She reports that she does not use drugs.  No Known Allergies  Family History  Problem Relation Age of Onset  . Liver disease Neg Hx     Prior to Admission medications   Not on File    Physical Exam:  Constitutional:Young female who appears in NAD, calm, comfortable Vitals:   09/17/16 1933 09/17/16 2225 09/18/16 0219 09/18/16 0341  BP: (!) 123/91 (!) 125/96 111/79 117/77  Pulse: (!) 148 (!) 101 96 91  Resp: 20 19 16 18   Temp: 98.8 F (37.1 C)  99.2 F (37.3 C) 98.7 F (37.1 C)  TempSrc: Oral  Oral Oral  SpO2: 100% 100% 100% 100%  Weight: 51.3 kg (113 lb)     Height: 5\' 7"  (1.702 m)      Eyes: PERRL, lids and conjunctivae normal ENMT: Mucous membranes are dry. Posterior pharynx clear of any exudate or lesions.Normal dentition.  Neck: normal, supple, no masses, no  thyromegaly Respiratory: clear to auscultation bilaterally, no wheezing, no crackles. Normal respiratory effort. No accessory muscle use.  Cardiovascular: Regular rate and rhythm, no murmurs / rubs / gallops. No extremity edema. 2+ pedal pulses. No carotid bruits.  Abdomen: no tenderness, no  masses palpated. No hepatosplenomegaly. Bowel sounds positive.  Musculoskeletal: no clubbing / cyanosis. No joint deformity upper and lower extremities. Good ROM, no contractures. Normal muscle tone.  Skin: no rashes, lesions, ulcers. No induration Neurologic: CN 2-12 grossly intact. Sensation intact, DTR normal. Strength 5/5 in all 4.  Psychiatric: Normal judgment and insight. Alert and oriented x 3. Normal mood.     Labs on Admission: I have personally reviewed following labs and imaging studies  CBC:  Recent Labs Lab 09/17/16 2037  WBC 7.8  NEUTROABS 5.9  HGB 15.3*  HCT 42.3  MCV 90.8  PLT 140*   Basic Metabolic Panel:  Recent Labs Lab 09/17/16 2037 09/17/16 2344  NA 130* 126*  K 3.3* 3.3*  CL 89* 92*  CO2 12* 12*  GLUCOSE 96 75  BUN 26* 20  CREATININE 1.13* 0.95  CALCIUM 9.4 7.8*   GFR: Estimated Creatinine Clearance: 71.4 mL/min (by C-G formula based on SCr of 0.95 mg/dL). Liver Function Tests:  Recent Labs Lab 09/17/16 2037  AST 353*  ALT 102*  ALKPHOS 94  BILITOT 3.3*  PROT 8.8*  ALBUMIN 4.8    Recent Labs Lab 09/17/16 2037  LIPASE 315*   No results for input(s): AMMONIA in the last 168 hours. Coagulation Profile: No results for input(s): INR, PROTIME in the last 168 hours. Cardiac Enzymes: No results for input(s): CKTOTAL, CKMB, CKMBINDEX, TROPONINI in the last 168 hours. BNP (last 3 results) No results for input(s): PROBNP in the last 8760 hours. HbA1C: No results for input(s): HGBA1C in the last 72 hours. CBG: No results for input(s): GLUCAP in the last 168 hours. Lipid Profile: No results for input(s): CHOL, HDL, LDLCALC, TRIG, CHOLHDL, LDLDIRECT in the last 72 hours. Thyroid Function Tests: No results for input(s): TSH, T4TOTAL, FREET4, T3FREE, THYROIDAB in the last 72 hours. Anemia Panel: No results for input(s): VITAMINB12, FOLATE, FERRITIN, TIBC, IRON, RETICCTPCT in the last 72 hours. Urine analysis:    Component Value  Date/Time   COLORURINE YELLOW 09/17/2016 2101   APPEARANCEUR CLOUDY (A) 09/17/2016 2101   LABSPEC >1.030 (H) 09/17/2016 2101   PHURINE 6.0 09/17/2016 2101   GLUCOSEU NEGATIVE 09/17/2016 2101   HGBUR LARGE (A) 09/17/2016 2101   BILIRUBINUR MODERATE (A) 09/17/2016 2101   KETONESUR >80 (A) 09/17/2016 2101   PROTEINUR >300 (A) 09/17/2016 2101   UROBILINOGEN 1.0 09/11/2013 1036   NITRITE NEGATIVE 09/17/2016 2101   LEUKOCYTESUR TRACE (A) 09/17/2016 2101   Sepsis Labs: No results found for this or any previous visit (from the past 240 hour(s)).   Radiological Exams on Admission: Dg Chest 2 View  Result Date: 09/17/2016 CLINICAL DATA:  Four-day history of cough, shortness of breath on exertion, nausea and vomiting, and dysuria. EXAM: CHEST  2 VIEW COMPARISON:  None. FINDINGS: Cardiomediastinal silhouette unremarkable. Lungs clear. Bronchovascular markings normal. Pulmonary vascularity normal. No visible pleural effusions. No pneumothorax. Slight thoracic dextroscoliosis. IMPRESSION: No acute cardiopulmonary disease. Electronically Signed   By: Hulan Saas M.D.   On: 09/17/2016 20:06   Ct Abdomen Pelvis W Contrast  Result Date: 09/18/2016 CLINICAL DATA:  Nausea vomiting EXAM: CT ABDOMEN AND PELVIS WITH CONTRAST TECHNIQUE: Multidetector CT imaging of the abdomen and pelvis was performed using the standard protocol following bolus  administration of intravenous contrast. CONTRAST:  ISOVUE-300 IOPAMIDOL (ISOVUE-300) INJECTION 61% COMPARISON:  09/17/2016 FINDINGS: Lower chest: Lung bases demonstrate no acute consolidation or pleural effusion. Normal heart size. Hepatobiliary: Enlarged fatty liver measuring up to 20 cm. No focal abnormality. No calcified gallstone or biliary dilatation Pancreas: Unremarkable. No pancreatic ductal dilatation or surrounding inflammatory changes. Spleen: Normal in size without focal abnormality. Adrenals/Urinary Tract: Adrenal glands are unremarkable. Kidneys are  normal, without renal calculi, focal lesion, or hydronephrosis. Bladder is unremarkable. Stomach/Bowel: Stomach is nonenlarged. No dilated small bowel. Suspected mild wall thickening of the ascending colon and proximal transverse colon. Small amount of fluid in the right paracolic gutter. Appendix within normal limits. Vascular/Lymphatic: No significant vascular findings are present. No enlarged abdominal or pelvic lymph nodes. Reproductive: Uterus is unremarkable. 2.6 cm intermediate density lesion left adnexa. Focal water density in the right pelvis which appears contiguous with a tubular structure, suspect that this is a hydrosalpinx, dilated tube measures up to 14 mm. 4 cm possible cyst in the right ovary. Other: Small free fluid in the pelvis.  Negative for free air. Musculoskeletal: No acute or significant osseous findings. IMPRESSION: 1. Suspicion of mild colon wall thickening/colitis of the ascending and transverse colon. Negative appendix. 2. Enlarged fatty liver 3. Right-sided hydrosalpinx. Intermediate density left adnexal lesion which may be correlated with a nonemergent pelvic ultrasound. 4. Small amount of free fluid in the pelvis. Electronically Signed   By: Jasmine Pang M.D.   On: 09/18/2016 00:05   US Abdomen Limited Ruq  Result Date: 09/17/2016 CLINICAL DATA:  Vomiting and diarrhea for 4 days. EXAM: ULTRASOUND ABDOMEN LIMITED RIGHT UPPER QUADRANT COMPARISON:  None. FINDINGS: Gallbladder: No gallstones or wall thickening visualized. No sonographic Murphy sign noted by sonographer. Common bile duct: Diameter: 2.4 mm Liver: Generalized coarsening of hepatic parenchymal echotexture without discrete lesion. Portal vein is patent on color Doppler imaging with normal direction of blood flow towards the liver. IMPRESSION: Normal gallbladder and bile ducts. Coarsened hepatic parenchymal echotexture may represent fatty infiltration. Electronically Signed   By: Ellery Plunk M.D.   On: 09/17/2016  22:43    EKG: Independently reviewed. Sinus rhythm at 94 bpm  Assessment/Plan Intractable nausea and vomiting: Acute. Patient presents with reported history of nausea vomiting unable to keep any food or liquids down. - Admit to a telemetry bed - Monitor intake and output - Antiemetics as needed - Clear liquid diet will need to advance as tolerated  Colitis: Acute. Patient found to have intestinal wall thickening of the ascending colon on CT scan of the abdomen. - Continue empiric ciprofloxacin and Flagyl  Elevated liver enzymes: Patient noted to have significant elevations in AST, ALT, and total bilirubin. AST to ALT ratio suggests alcohol abuse. - Check CMP   - Follow-up acute hepatitis panel  Lactic acidosis: resolved. Initial lactic acid elevated at 4.68 suspect secondary to severe dehydration. Following with IV fluids. Repeat lactic acid within normal limits.  Urinary tract infection: Acute. UA was positive for signs of infection. Patient was given Rocephin initially 1 dose. - Add on urine culture - Adjust antibiotics as needed  Metabolic Acidosis with elevated anion gap: Initial CO2 12 with anion gap of 29. - Continue to monitor.   Hyponatremia: Acute. Initial sodium 130 repeat BMP sodium dropping to 126. Patient appears to have done over 4 L of IV fluids fluids well in the emergency department. - Follow-up CMP - Will need to reassess for continued IV fluid management   Renal  insufficiency:Resolving. Initial creatinine 1.13 on admission with elevated BUN of 26 to suggest dehydration. Following IV fluid hydration repeat BMP was noted to be more within normal limits at 0.95. - Continue to monitor   Hypokalemia: Acute. Initial potassium 3.3 on admission - Give 30 meq of potassium chloride IV - Check magnesium - Continue to replace as needed   Alcohol abuse - CWIA protocols   GI prophylaxis: Protonix DVT prophylaxis: lovenox Code Status: full Family Communication: No  family present at bedside  Disposition Plan: Likely discharge home once medically stable  Consults called: None  Admission status: Inpatient  Clydie Braun MD [facility] Pager [phone]   If 7PM-7AM, please contact night-coverage [email or web address] Password [email or web address]  09/18/2016, 4:46 AM

## 2016-09-18 NOTE — Progress Notes (Signed)
Pt wants something like Benadryl to help her sleep. MD on call notified.

## 2016-09-19 DIAGNOSIS — K759 Inflammatory liver disease, unspecified: Secondary | ICD-10-CM

## 2016-09-19 LAB — BASIC METABOLIC PANEL
ANION GAP: 8 (ref 5–15)
BUN: 6 mg/dL (ref 6–20)
CHLORIDE: 101 mmol/L (ref 101–111)
CO2: 26 mmol/L (ref 22–32)
Calcium: 8.2 mg/dL — ABNORMAL LOW (ref 8.9–10.3)
Creatinine, Ser: 0.71 mg/dL (ref 0.44–1.00)
GFR calc non Af Amer: >60 mL/min (ref >60)
Glucose, Bld: 111 mg/dL — ABNORMAL HIGH (ref 65–99)
Potassium: 2.9 mmol/L — ABNORMAL LOW (ref 3.5–5.1)
Sodium: 135 mmol/L (ref 135–145)

## 2016-09-19 LAB — HEPATIC FUNCTION PANEL
ALBUMIN: 3 g/dL — AB (ref 3.5–5.0)
ALT: 59 U/L — AB (ref 14–54)
AST: 211 U/L — ABNORMAL HIGH (ref 15–41)
Alkaline Phosphatase: 69 U/L (ref 38–126)
Bilirubin, Direct: 0.6 mg/dL — ABNORMAL HIGH (ref 0.1–0.5)
Indirect Bilirubin: 1 mg/dL — ABNORMAL HIGH (ref 0.3–0.9)
TOTAL PROTEIN: 5.3 g/dL — AB (ref 6.5–8.1)
Total Bilirubin: 1.6 mg/dL — ABNORMAL HIGH (ref 0.3–1.2)

## 2016-09-19 LAB — HEPATITIS PANEL, ACUTE
HCV Ab: <0.1 s/co ratio (ref 0.0–0.9)
HEP B C IGM: NEGATIVE
HEP B S AG: NEGATIVE
Hep A IgM: NEGATIVE

## 2016-09-19 LAB — MAGNESIUM: Magnesium: 1.7 mg/dL (ref 1.7–2.4)

## 2016-09-19 LAB — POTASSIUM: Potassium: 3.2 mmol/L — ABNORMAL LOW (ref 3.5–5.1)

## 2016-09-19 MED ORDER — POTASSIUM CHLORIDE 10 MEQ/100ML IV SOLN
10.0000 meq | INTRAVENOUS | Status: AC
  Administered 2016-09-19 (×2): 10 meq via INTRAVENOUS
  Filled 2016-09-19 (×2): qty 100

## 2016-09-19 MED ORDER — POTASSIUM CHLORIDE CRYS ER 20 MEQ PO TBCR
40.0000 meq | EXTENDED_RELEASE_TABLET | Freq: Two times a day (BID) | ORAL | Status: DC
  Administered 2016-09-19: 40 meq via ORAL
  Filled 2016-09-19: qty 2

## 2016-09-19 MED ORDER — FOLIC ACID 1 MG PO TABS: 1.0000 mg | ORAL_TABLET | Freq: Every day | ORAL | 0 refills | Status: DC

## 2016-09-19 MED ORDER — METRONIDAZOLE 500 MG PO TABS: 500.0000 mg | ORAL_TABLET | Freq: Three times a day (TID) | ORAL | 0 refills | Status: DC

## 2016-09-19 MED ORDER — POTASSIUM CHLORIDE CRYS ER 20 MEQ PO TBCR: 40.0000 meq | EXTENDED_RELEASE_TABLET | Freq: Two times a day (BID) | ORAL | 0 refills | Status: DC

## 2016-09-19 MED ORDER — ZOLPIDEM TARTRATE 5 MG PO TABS
5.0000 mg | ORAL_TABLET | Freq: Once | ORAL | Status: AC
  Administered 2016-09-19: 5 mg via ORAL
  Filled 2016-09-19: qty 1

## 2016-09-19 MED ORDER — CIPROFLOXACIN HCL 500 MG PO TABS: 500.0000 mg | ORAL_TABLET | Freq: Two times a day (BID) | ORAL | 0 refills | Status: DC

## 2016-09-19 MED ORDER — ENSURE ENLIVE PO LIQD: 237.0000 mL | Freq: Three times a day (TID) | ORAL | 12 refills | Status: DC

## 2016-09-19 MED ORDER — THIAMINE HCL 100 MG PO TABS: 100.0000 mg | ORAL_TABLET | Freq: Every day | ORAL | 0 refills | Status: DC

## 2016-09-19 MED ORDER — PANTOPRAZOLE SODIUM 40 MG PO TBEC: 40.0000 mg | DELAYED_RELEASE_TABLET | Freq: Every day | ORAL | 0 refills | Status: DC

## 2016-09-19 MED ORDER — MAGNESIUM SULFATE 2 GM/50ML IV SOLN
2.0000 g | Freq: Once | INTRAVENOUS | Status: AC
  Administered 2016-09-19: 2 g via INTRAVENOUS
  Filled 2016-09-19: qty 50

## 2016-09-19 NOTE — Progress Notes (Signed)
Maureen Hanna to be D/C'd Home per MD order.  Discussed with the patient and all questions fully answered.  VSS, Skin clean, dry and intact without evidence of skin break down, no evidence of skin tears noted. IV catheter discontinued intact. Site without signs and symptoms of complications. Dressing and pressure applied.  An After Visit Summary was printed and given to the patient. Patient received prescription.  D/c education completed with patient/family including follow up instructions, medication list, d/c activities limitations if indicated, with other d/c instructions as indicated by MD - patient able to verbalize understanding, all questions fully answered.   Patient instructed to return to ED, call 911, or call MD for any changes in condition.   Patient escorted to exit and D/C'd home via private auto.  Grayling Congressvan J Penny Arrambide 09/19/2016 5:30 PM

## 2016-09-21 NOTE — Discharge Summary (Signed)
Physician Discharge Summary  Maureen LeylandShakira Hanna ZOX:[id number] DOB: 01-Sep-1990 DOA: 09/17/2016  PCP: Patient, No Pcp Per  Admit date: 09/17/2016 Discharge date: 09/20/2016  [facility]  Disposition:  Home.   Recommendations for Outpatient Follow-up:  1. Follow up with PCP in 1 week.  2. Please obtain BMP/CBC in one week Please follow up with liver function tests on Tuesday.   Discharge Condition: guarded.  CODE STATUS:full code.  Diet recommendation: Heart Healthy   Brief/Interim Summary: Maureen GuntherShakira Carringtonis a 26 y.o.femalewith medical history significant of alcohol abuse; presents with at least a four-day history of nausea and vomiting . She was found to have mild to moderate pancreatitis and elevated liver function tests.   Discharge Diagnoses:  Principal Problem:   Intractable nausea and vomiting Active Problems:   Acidosis   Colitis   Elevated liver enzymes   Hyponatremia   Hypokalemia   Alcohol abuse  Intractable nausea and vomiting: possibly from alcohol abuse and pancreatitis.  Started on IV fluids, anti emetics and pain meds.  Npo, and gradually started on clears and advanced as tolerated.  Liver function tests elevated from hepatitis from alcohol use.  Discussed ind etail about cessation of alcohol use and offered her resources for alcohol abuse.  On discharge she was able to tolerate regular diet and recommended to follow upwith PCP in less than a week and repeat liver function tests.    Hypokalemia  Replaced.    Colitis:  Started on IV ciprofloxacin and IV flagyl, discharged her on the same for 10 days.   Discharge Instructions  Discharge Instructions    Diet general    Complete by:  As directed    Discharge instructions    Complete by:  As directed    Please follow up with PCP in one week and check your liver function tests in one week.     Allergies as of 09/19/2016   No Known Allergies     Medication List    TAKE these medications    ciprofloxacin 500 MG tablet Commonly known as:  CIPRO Take 1 tablet (500 mg total) by mouth 2 (two) times daily.   feeding supplement (ENSURE ENLIVE) Liqd Take 237 mLs by mouth 3 (three) times daily between meals.   folic acid 1 MG tablet Commonly known as:  FOLVITE Take 1 tablet (1 mg total) by mouth daily.   metroNIDAZOLE 500 MG tablet Commonly known as:  FLAGYL Take 1 tablet (500 mg total) by mouth 3 (three) times daily.   pantoprazole 40 MG tablet Commonly known as:  PROTONIX Take 1 tablet (40 mg total) by mouth daily.   potassium chloride SA 20 MEQ tablet Commonly known as:  K-DUR,KLOR-CON Take 2 tablets (40 mEq total) by mouth 2 (two) times daily.   thiamine 100 MG tablet Take 1 tablet (100 mg total) by mouth daily.            Discharge Care Instructions        Start     Ordered   09/20/16 0000  folic acid (FOLVITE) 1 MG tablet  Daily     09/19/16 1546   09/20/16 0000  thiamine 100 MG tablet  Daily     09/19/16 1546   09/19/16 0000  feeding supplement, ENSURE ENLIVE, (ENSURE ENLIVE) LIQD  3 times daily between meals     09/19/16 1546   09/19/16 0000  potassium chloride SA (K-DUR,KLOR-CON) 20 MEQ tablet  2 times daily     09/19/16 1546   09/19/16  0000  ciprofloxacin (CIPRO) 500 MG tablet  2 times daily     09/19/16 1546   09/19/16 0000  metroNIDAZOLE (FLAGYL) 500 MG tablet  3 times daily     09/19/16 1546   09/19/16 0000  pantoprazole (PROTONIX) 40 MG tablet  Daily     09/19/16 1546   09/19/16 0000  Discharge instructions    Comments:  Please follow up with PCP in one week and check your liver function tests in one week.   09/19/16 1546   09/19/16 0000  Diet general     09/19/16 1546      No Known Allergies  Consultations:  None.    Procedures/Studies: Dg Chest 2 View  Result Date: 09/17/2016 CLINICAL DATA:  Four-day history of cough, shortness of breath on exertion, nausea and vomiting, and dysuria. EXAM: CHEST  2 VIEW COMPARISON:  None.  FINDINGS: Cardiomediastinal silhouette unremarkable. Lungs clear. Bronchovascular markings normal. Pulmonary vascularity normal. No visible pleural effusions. No pneumothorax. Slight thoracic dextroscoliosis. IMPRESSION: No acute cardiopulmonary disease. Electronically Signed   By: Hulan Saas M.D.   On: 09/17/2016 20:06   Ct Abdomen Pelvis W Contrast  Result Date: 09/18/2016 CLINICAL DATA:  Nausea vomiting EXAM: CT ABDOMEN AND PELVIS WITH CONTRAST TECHNIQUE: Multidetector CT imaging of the abdomen and pelvis was performed using the standard protocol following bolus administration of intravenous contrast. CONTRAST:  ISOVUE-300 IOPAMIDOL (ISOVUE-300) INJECTION 61% COMPARISON:  09/17/2016 FINDINGS: Lower chest: Lung bases demonstrate no acute consolidation or pleural effusion. Normal heart size. Hepatobiliary: Enlarged fatty liver measuring up to 20 cm. No focal abnormality. No calcified gallstone or biliary dilatation Pancreas: Unremarkable. No pancreatic ductal dilatation or surrounding inflammatory changes. Spleen: Normal in size without focal abnormality. Adrenals/Urinary Tract: Adrenal glands are unremarkable. Kidneys are normal, without renal calculi, focal lesion, or hydronephrosis. Bladder is unremarkable. Stomach/Bowel: Stomach is nonenlarged. No dilated small bowel. Suspected mild wall thickening of the ascending colon and proximal transverse colon. Small amount of fluid in the right paracolic gutter. Appendix within normal limits. Vascular/Lymphatic: No significant vascular findings are present. No enlarged abdominal or pelvic lymph nodes. Reproductive: Uterus is unremarkable. 2.6 cm intermediate density lesion left adnexa. Focal water density in the right pelvis which appears contiguous with a tubular structure, suspect that this is a hydrosalpinx, dilated tube measures up to 14 mm. 4 cm possible cyst in the right ovary. Other: Small free fluid in the pelvis.  Negative for free air.  Musculoskeletal: No acute or significant osseous findings. IMPRESSION: 1. Suspicion of mild colon wall thickening/colitis of the ascending and transverse colon. Negative appendix. 2. Enlarged fatty liver 3. Right-sided hydrosalpinx. Intermediate density left adnexal lesion which may be correlated with a nonemergent pelvic ultrasound. 4. Small amount of free fluid in the pelvis. Electronically Signed   By: Jasmine Pang M.D.   On: 09/18/2016 00:05   US Abdomen Limited Ruq  Result Date: 09/17/2016 CLINICAL DATA:  Vomiting and diarrhea for 4 days. EXAM: ULTRASOUND ABDOMEN LIMITED RIGHT UPPER QUADRANT COMPARISON:  None. FINDINGS: Gallbladder: No gallstones or wall thickening visualized. No sonographic Murphy sign noted by sonographer. Common bile duct: Diameter: 2.4 mm Liver: Generalized coarsening of hepatic parenchymal echotexture without discrete lesion. Portal vein is patent on color Doppler imaging with normal direction of blood flow towards the liver. IMPRESSION: Normal gallbladder and bile ducts. Coarsened hepatic parenchymal echotexture may represent fatty infiltration. Electronically Signed   By: Ellery Plunk M.D.   On: 09/17/2016 22:43    None.  Subjective: No new complaints. No abd pain, no nausea, or vomiting. Able to tolerate soft diet .   Discharge Exam: Vitals:   09/19/16 0446 09/19/16 1521  BP: 102/67 107/71  Pulse: 79 (!) 114  Resp: 16 18  Temp: 98.8 F (37.1 C) 98.8 F (37.1 C)  SpO2: 100% 100%   Vitals:   09/18/16 1440 09/18/16 2149 09/19/16 0446 09/19/16 1521  BP: 98/67 106/67 102/67 107/71  Pulse: (!) 105 99 79 (!) 114  Resp: 14 18 16 18   Temp:  99.9 F (37.7 C) 98.8 F (37.1 C) 98.8 F (37.1 C)  TempSrc:  Oral Oral Oral  SpO2: 100% 100% 100% 100%  Weight:      Height:        General: Pt is alert, awake, not in acute distress Cardiovascular: RRR, S1/S2 +, no rubs, no gallops Respiratory: CTA bilaterally, no wheezing, no rhonchi Abdominal: Soft, NT,  ND, bowel sounds + Extremities: no edema, no cyanosis    The results of significant diagnostics from this hospitalization (including imaging, microbiology, ancillary and laboratory) are listed below for reference.     Microbiology: No results found for this or any previous visit (from the past 240 hour(s)).   Labs: BNP (last 3 results) No results for input(s): BNP in the last 8760 hours. Basic Metabolic Panel:  Recent Labs Lab 09/17/16 2037 09/17/16 2344 09/18/16 0519 09/19/16 0604 09/19/16 1446  NA 130* 126* 134* 135  --   K 3.3* 3.3* 3.3* 2.9* 3.2*  CL 89* 92* 103 101  --   CO2 12* 12* 16* 26  --   GLUCOSE 96 75 62* 111*  --   BUN 26* 20 13 6   --   CREATININE 1.13* 0.95 1.12* 0.71  --   CALCIUM 9.4 7.8* 7.7* 8.2*  --   MG  --   --  1.4* 1.7  --    Liver Function Tests:  Recent Labs Lab 09/17/16 2037 09/18/16 0519 09/19/16 0948  AST 353* 260* 211*  ALT 102* 69* 59*  ALKPHOS 94 67 69  BILITOT 3.3* 2.8* 1.6*  PROT 8.8* 6.2* 5.3*  ALBUMIN 4.8 3.3* 3.0*    Recent Labs Lab 09/17/16 2037  LIPASE 315*   No results for input(s): AMMONIA in the last 168 hours. CBC:  Recent Labs Lab 09/17/16 2037 09/18/16 0519  WBC 7.8 7.5  NEUTROABS 5.9  --   HGB 15.3* 11.3*  HCT 42.3 31.7*  MCV 90.8 90.6  PLT 140* 98*   Cardiac Enzymes: No results for input(s): CKTOTAL, CKMB, CKMBINDEX, TROPONINI in the last 168 hours. BNP: Invalid input(s): POCBNP CBG: No results for input(s): GLUCAP in the last 168 hours. D-Dimer No results for input(s): DDIMER in the last 72 hours. Hgb A1c No results for input(s): HGBA1C in the last 72 hours. Lipid Profile No results for input(s): CHOL, HDL, LDLCALC, TRIG, CHOLHDL, LDLDIRECT in the last 72 hours. Thyroid function studies No results for input(s): TSH, T4TOTAL, T3FREE, THYROIDAB in the last 72 hours.  Invalid input(s): FREET3 Anemia work up No results for input(s): VITAMINB12, FOLATE, FERRITIN, TIBC, IRON, RETICCTPCT in  the last 72 hours. Urinalysis    Component Value Date/Time   COLORURINE YELLOW 09/17/2016 2101   APPEARANCEUR CLOUDY (A) 09/17/2016 2101   LABSPEC >1.030 (H) 09/17/2016 2101   PHURINE 6.0 09/17/2016 2101   GLUCOSEU NEGATIVE 09/17/2016 2101   HGBUR LARGE (A) 09/17/2016 2101   BILIRUBINUR MODERATE (A) 09/17/2016 2101   KETONESUR >80 (A) 09/17/2016 2101  PROTEINUR >300 (A) 09/17/2016 2101   UROBILINOGEN 1.0 09/11/2013 1036   NITRITE NEGATIVE 09/17/2016 2101   LEUKOCYTESUR TRACE (A) 09/17/2016 2101   Sepsis Labs Invalid input(s): PROCALCITONIN,  WBC,  LACTICIDVEN Microbiology No results found for this or any previous visit (from the past 240 hour(s)).   Time coordinating discharge: Over 30 minutes  SIGNED:   Kathlen Mody, MD  [facility] 09/21/2016, 10:19 AM Pager   If 7PM-7AM, please contact night-coverage [email or web address] Password [email or web address]

## 2016-09-29 ENCOUNTER — Inpatient Hospital Stay (HOSPITAL_BASED_OUTPATIENT_CLINIC_OR_DEPARTMENT_OTHER)
Admission: EM | Admit: 2016-09-29 | Discharge: 2016-10-01 | DRG: 641 | Disposition: A | Discharge status: Home / Self Care | Payer: BC Managed Care – PPO | Attending: Internal Medicine | Admitting: Internal Medicine

## 2016-09-29 ENCOUNTER — Encounter (HOSPITAL_BASED_OUTPATIENT_CLINIC_OR_DEPARTMENT_OTHER): Payer: Self-pay | Admitting: Emergency Medicine

## 2016-09-29 ENCOUNTER — Emergency Department (HOSPITAL_BASED_OUTPATIENT_CLINIC_OR_DEPARTMENT_OTHER): Payer: BC Managed Care – PPO

## 2016-09-29 ENCOUNTER — Other Ambulatory Visit: Payer: Self-pay

## 2016-09-29 DIAGNOSIS — R0602 Shortness of breath: Secondary | ICD-10-CM | POA: Diagnosis not present

## 2016-09-29 DIAGNOSIS — I509 Heart failure, unspecified: Secondary | ICD-10-CM

## 2016-09-29 DIAGNOSIS — R0789 Other chest pain: Secondary | ICD-10-CM | POA: Diagnosis not present

## 2016-09-29 DIAGNOSIS — Z811 Family history of alcohol abuse and dependence: Secondary | ICD-10-CM

## 2016-09-29 DIAGNOSIS — R079 Chest pain, unspecified: Secondary | ICD-10-CM | POA: Diagnosis present

## 2016-09-29 DIAGNOSIS — E8770 Fluid overload, unspecified: Secondary | ICD-10-CM | POA: Diagnosis not present

## 2016-09-29 DIAGNOSIS — R0601 Orthopnea: Secondary | ICD-10-CM | POA: Diagnosis not present

## 2016-09-29 DIAGNOSIS — I200 Unstable angina: Secondary | ICD-10-CM | POA: Diagnosis not present

## 2016-09-29 DIAGNOSIS — F1010 Alcohol abuse, uncomplicated: Secondary | ICD-10-CM | POA: Diagnosis present

## 2016-09-29 DIAGNOSIS — I340 Nonrheumatic mitral (valve) insufficiency: Secondary | ICD-10-CM | POA: Diagnosis not present

## 2016-09-29 HISTORY — DX: Dehydration: E86.0

## 2016-09-29 HISTORY — DX: Heart failure, unspecified: I50.9

## 2016-09-29 HISTORY — DX: Sleep apnea, unspecified: G47.30

## 2016-09-29 HISTORY — DX: Gastro-esophageal reflux disease without esophagitis: K21.9

## 2016-09-29 LAB — ACETAMINOPHEN LEVEL

## 2016-09-29 LAB — HEPATIC FUNCTION PANEL
ALK PHOS: 79 U/L (ref 38–126)
ALT: 62 U/L — AB (ref 14–54)
AST: 121 U/L — AB (ref 15–41)
Albumin: 3.4 g/dL — ABNORMAL LOW (ref 3.5–5.0)
BILIRUBIN DIRECT: 0.3 mg/dL (ref 0.1–0.5)
BILIRUBIN INDIRECT: 0.7 mg/dL (ref 0.3–0.9)
BILIRUBIN TOTAL: 1 mg/dL (ref 0.3–1.2)
Total Protein: 6.5 g/dL (ref 6.5–8.1)

## 2016-09-29 LAB — BASIC METABOLIC PANEL
ANION GAP: 10 (ref 5–15)
BUN: 7 mg/dL (ref 6–20)
CHLORIDE: 103 mmol/L (ref 101–111)
CO2: 25 mmol/L (ref 22–32)
Calcium: 8.7 mg/dL — ABNORMAL LOW (ref 8.9–10.3)
Creatinine, Ser: 0.44 mg/dL (ref 0.44–1.00)
GFR calc non Af Amer: >60 mL/min (ref >60)
GLUCOSE: 88 mg/dL (ref 65–99)
POTASSIUM: 3.4 mmol/L — AB (ref 3.5–5.1)
Sodium: 138 mmol/L (ref 135–145)

## 2016-09-29 LAB — CBC
HCT: 25.7 % — ABNORMAL LOW (ref 36.0–46.0)
HEMATOCRIT: 28.8 % — AB (ref 36.0–46.0)
HEMOGLOBIN: 10.1 g/dL — AB (ref 12.0–15.0)
Hemoglobin: 8.8 g/dL — ABNORMAL LOW (ref 12.0–15.0)
MCH: 32.8 pg (ref 26.0–34.0)
MCH: 32 pg (ref 26.0–34.0)
MCHC: 35.1 g/dL (ref 30.0–36.0)
MCHC: 34.2 g/dL (ref 30.0–36.0)
MCV: 93.5 fL (ref 78.0–100.0)
MCV: 93.5 fL (ref 78.0–100.0)
PLATELETS: 473 10*3/uL — AB (ref 150–400)
Platelets: 629 10*3/uL — ABNORMAL HIGH (ref 150–400)
RBC: 3.08 MIL/uL — AB (ref 3.87–5.11)
RBC: 2.75 MIL/uL — AB (ref 3.87–5.11)
RDW: 16.4 % — ABNORMAL HIGH (ref 11.5–15.5)
RDW: 16.4 % — ABNORMAL HIGH (ref 11.5–15.5)
WBC: 11.5 10*3/uL — ABNORMAL HIGH (ref 4.0–10.5)
WBC: 9.7 10*3/uL (ref 4.0–10.5)

## 2016-09-29 LAB — CREATININE, SERUM
CREATININE: 0.68 mg/dL (ref 0.44–1.00)
GFR calc non Af Amer: >60 mL/min (ref >60)

## 2016-09-29 LAB — BRAIN NATRIURETIC PEPTIDE: B Natriuretic Peptide: 395.9 pg/mL — ABNORMAL HIGH (ref 0.0–100.0)

## 2016-09-29 LAB — MAGNESIUM: Magnesium: 1.6 mg/dL — ABNORMAL LOW (ref 1.7–2.4)

## 2016-09-29 LAB — TROPONIN I

## 2016-09-29 MED ORDER — THIAMINE HCL 100 MG/ML IJ SOLN: 100.0000 mg | Freq: Every day | INTRAMUSCULAR | Status: DC

## 2016-09-29 MED ORDER — FOLIC ACID 1 MG PO TABS
1.0000 mg | ORAL_TABLET | Freq: Every day | ORAL | Status: DC
  Administered 2016-09-29 – 2016-10-01 (×3): 1 mg via ORAL
  Filled 2016-09-29 (×3): qty 1

## 2016-09-29 MED ORDER — ENOXAPARIN SODIUM 40 MG/0.4ML ~~LOC~~ SOLN
40.0000 mg | SUBCUTANEOUS | Status: DC
  Filled 2016-09-29 (×2): qty 0.4

## 2016-09-29 MED ORDER — SODIUM CHLORIDE 0.9 % IV SOLN: 250.0000 mL | INTRAVENOUS | Status: DC | PRN

## 2016-09-29 MED ORDER — LORAZEPAM 1 MG PO TABS
1.0000 mg | ORAL_TABLET | Freq: Four times a day (QID) | ORAL | Status: DC | PRN
  Administered 2016-09-29 – 2016-09-30 (×2): 1 mg via ORAL
  Filled 2016-09-29 (×2): qty 1

## 2016-09-29 MED ORDER — FUROSEMIDE 10 MG/ML IJ SOLN
20.0000 mg | Freq: Two times a day (BID) | INTRAMUSCULAR | Status: DC
  Administered 2016-09-29 – 2016-10-01 (×4): 20 mg via INTRAVENOUS
  Filled 2016-09-29 (×4): qty 2

## 2016-09-29 MED ORDER — ONDANSETRON HCL 4 MG/2ML IJ SOLN: 4.0000 mg | Freq: Four times a day (QID) | INTRAMUSCULAR | Status: DC | PRN

## 2016-09-29 MED ORDER — ADULT MULTIVITAMIN W/MINERALS CH
1.0000 | ORAL_TABLET | Freq: Every day | ORAL | Status: DC
  Administered 2016-09-29 – 2016-10-01 (×3): 1 via ORAL
  Filled 2016-09-29 (×3): qty 1

## 2016-09-29 MED ORDER — LORAZEPAM 2 MG/ML IJ SOLN: 1.0000 mg | Freq: Four times a day (QID) | INTRAMUSCULAR | Status: DC | PRN

## 2016-09-29 MED ORDER — LORAZEPAM 2 MG/ML IJ SOLN: 0.0000 mg | Freq: Two times a day (BID) | INTRAMUSCULAR | Status: DC

## 2016-09-29 MED ORDER — SODIUM CHLORIDE 0.9% FLUSH: 3.0000 mL | INTRAVENOUS | Status: DC | PRN

## 2016-09-29 MED ORDER — SODIUM CHLORIDE 0.9% FLUSH
3.0000 mL | Freq: Two times a day (BID) | INTRAVENOUS | Status: DC
  Administered 2016-09-29 – 2016-10-01 (×3): 3 mL via INTRAVENOUS

## 2016-09-29 MED ORDER — FOLIC ACID 1 MG PO TABS: 1.0000 mg | ORAL_TABLET | Freq: Every day | ORAL | Status: DC

## 2016-09-29 MED ORDER — POTASSIUM CHLORIDE CRYS ER 20 MEQ PO TBCR
40.0000 meq | EXTENDED_RELEASE_TABLET | Freq: Two times a day (BID) | ORAL | Status: DC
  Administered 2016-09-29 – 2016-10-01 (×4): 40 meq via ORAL
  Filled 2016-09-29 (×4): qty 2

## 2016-09-29 MED ORDER — VITAMIN B-1 100 MG PO TABS: 100.0000 mg | ORAL_TABLET | Freq: Every day | ORAL | Status: DC

## 2016-09-29 MED ORDER — LORAZEPAM 2 MG/ML IJ SOLN: 0.0000 mg | Freq: Four times a day (QID) | INTRAMUSCULAR | Status: DC

## 2016-09-29 MED ORDER — VITAMIN B-1 100 MG PO TABS
100.0000 mg | ORAL_TABLET | Freq: Every day | ORAL | Status: DC
  Administered 2016-09-29 – 2016-10-01 (×3): 100 mg via ORAL
  Filled 2016-09-29 (×3): qty 1

## 2016-09-29 MED ORDER — ACETAMINOPHEN 325 MG PO TABS: 650.0000 mg | ORAL_TABLET | ORAL | Status: DC | PRN

## 2016-09-29 NOTE — ED Notes (Signed)
RRT Brett CanalesSteve ambulating Pt

## 2016-09-29 NOTE — ED Provider Notes (Signed)
[facility] Provider Note   CSN: [id number] Arrival date & time: 09/29/16  1049     History   Chief Complaint Chief Complaint  Patient presents with  . Leg Pain    swelling    HPI Maureen Hanna is a 26 y.o. female who presents emergency Department with chief complaint of leg swelling. Patient was admitted for HAGMA and intractable nausea and vomiting. She hadn't multiple electrolyte abnormalities. She has a history of alcohol abuse and alcoholic hepatitis. The patient states that she is taking Cipro, Flagyl and vitamins including folic acid. Patient has been taking her medications. She has noticed some swelling in her legs over the past several days which improves when she elevates her leg. Last night she began having orthopnea when she was elevating her legs which brought her to the emergency department today. She denies chest pain, pleuritic pain, hemoptysis, unilateral leg swelling.   HPI  Past Medical History:  Diagnosis Date  . Alcohol abuse   . Dehydration     Patient Active Problem List   Diagnosis Date Noted  . Intractable nausea and vomiting 09/18/2016  . Acidosis 09/18/2016  . Colitis 09/18/2016  . Elevated liver enzymes 09/18/2016  . Hyponatremia 09/18/2016  . Hypokalemia 09/18/2016  . Alcohol abuse 09/18/2016    History reviewed. No pertinent surgical history.  OB History    No data available       Home Medications    Prior to Admission medications   Medication Sig Start Date End Date Taking? Authorizing Provider  ciprofloxacin (CIPRO) 500 MG tablet Take 1 tablet (500 mg total) by mouth 2 (two) times daily. 09/19/16 09/29/16 Yes Kathlen Mody, MD  feeding supplement, ENSURE ENLIVE, (ENSURE ENLIVE) LIQD Take 237 mLs by mouth 3 (three) times daily between meals. 09/19/16  Yes Kathlen Mody, MD  folic acid (FOLVITE) 1 MG tablet Take 1 tablet (1 mg total) by mouth daily. 09/20/16  Yes Kathlen Mody, MD  metroNIDAZOLE (FLAGYL) 500 MG tablet Take  1 tablet (500 mg total) by mouth 3 (three) times daily. 09/19/16 09/29/16 Yes Kathlen Mody, MD  pantoprazole (PROTONIX) 40 MG tablet Take 1 tablet (40 mg total) by mouth daily. 09/19/16 09/29/16 Yes Kathlen Mody, MD  potassium chloride SA (K-DUR,KLOR-CON) 20 MEQ tablet Take 2 tablets (40 mEq total) by mouth 2 (two) times daily. 09/19/16  Yes Kathlen Mody, MD  thiamine 100 MG tablet Take 1 tablet (100 mg total) by mouth daily. 09/20/16  Yes Kathlen Mody, MD    Family History Family History  Problem Relation Age of Onset  . Liver disease Neg Hx     Social History Social History  Substance Use Topics  . Smoking status: Never Smoker  . Smokeless tobacco: Never Used  . Alcohol use Yes     Comment: daily     Allergies   Patient has no known allergies.   Review of Systems Review of Systems Ten systems reviewed and are negative for acute change, except as noted in the HPI.    Physical Exam Updated Vital Signs BP (!) 129/93   Pulse (!) 50   Temp 99.7 F (37.6 C) (Oral)   Resp 14   Ht  (1.702 m)   Wt 63.8 kg (140 lb 10.5 oz)   LMP 09/16/2016   SpO2 100%   BMI 22.03 kg/m   Physical Exam  Constitutional: She is oriented to person, place, and time. She appears well-developed and well-nourished. No distress.  HENT:  Head: Normocephalic and  atraumatic.  Eyes: Conjunctivae are normal. No scleral icterus.  Neck: Normal range of motion. JVD present.  Cardiovascular: Normal rate, regular rhythm and normal heart sounds.  Exam reveals no gallop and no friction rub.   No murmur heard. 2+ pitting edema BL  Pulmonary/Chest: Effort normal and breath sounds normal. No respiratory distress.  Abdominal: Soft. Bowel sounds are normal. She exhibits no distension and no mass. There is no tenderness. There is no guarding.  Fine crackles and diminished breath sounds in the bases  Neurological: She is alert and oriented to person, place, and time.  Skin: Skin is warm and dry. She is not  diaphoretic.  Psychiatric: Her behavior is normal.  Nursing note and vitals reviewed.    ED Treatments / Results  Labs (all labs ordered are listed, but only abnormal results are displayed) Labs Reviewed  BASIC METABOLIC PANEL - Abnormal; Notable for the following:       Result Value   Potassium 3.4 (*)    Calcium 8.7 (*)    All other components within normal limits  CBC - Abnormal; Notable for the following:    WBC 11.5 (*)    RBC 3.08 (*)    Hemoglobin 10.1 (*)    HCT 28.8 (*)    RDW 16.4 (*)    Platelets 629 (*)    All other components within normal limits  HEPATIC FUNCTION PANEL - Abnormal; Notable for the following:    Albumin 3.4 (*)    AST 121 (*)    ALT 62 (*)    All other components within normal limits  MAGNESIUM - Abnormal; Notable for the following:    Magnesium 1.6 (*)    All other components within normal limits  BRAIN NATRIURETIC PEPTIDE - Abnormal; Notable for the following:    B Natriuretic Peptide 395.9 (*)    All other components within normal limits  ACETAMINOPHEN LEVEL - Abnormal; Notable for the following:    Acetaminophen (Tylenol), Serum <10 (*)    All other components within normal limits  TROPONIN I    EKG  EKG Interpretation  Date/Time:  Tuesday September 29 2016 12:16:00 EDT Ventricular Rate:  44 PR Interval:    QRS Duration: 70 QT Interval:  480 QTC Calculation: 411 R Axis:   97 Text Interpretation:  new Sinus bradycardia Borderline right axis deviation Borderline Q waves in lateral leads Baseline wander in lead(s) V3 Confirmed by Gwyneth Sprout ([id number]) on 09/29/2016 12:37:02 PM       Radiology Dg Chest 2 View  Result Date: 09/29/2016 CLINICAL DATA:  Chest tightness. EXAM: CHEST  2 VIEW COMPARISON:  September 17, 2016. FINDINGS: The cardiomediastinal silhouette is normal in size. Normal pulmonary vascularity. Trace bilateral pleural effusions with right greater than left bibasilar atelectasis. No consolidation or pneumothorax. No  acute osseous abnormality. IMPRESSION: New trace bilateral pleural effusions with adjacent bibasilar atelectasis. Electronically Signed   By: Obie Dredge M.D.   On: 09/29/2016 12:19    Procedures Procedures (including critical care time)  Medications Ordered in ED Medications - No data to display   Initial Impression / Assessment and Plan / ED Course  I have reviewed the triage vital signs and the nursing notes.  Pertinent labs & imaging results that were available during my care of the patient were reviewed by me and considered in my medical decision making (see chart for details).      Patient with pitting edema,  BL pleural effusions and JVD.+ Huston Foley cardia Patient also  has elevated BNP and PT suggestive of new onset CHF. 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 She will need admission for further workup  Final Clinical Impressions(s) / ED Diagnoses   Final diagnoses:  Acute congestive heart failure, unspecified heart failure type [facility](HCC)    New Prescriptions New Prescriptions   No medications on file     Arthor CaptainHarris, Derricka Mertz, PA-C 09/29/16 1440    Gwyneth SproutPlunkett, Whitney, MD 09/29/16 [phone]

## 2016-09-29 NOTE — Progress Notes (Signed)
Pt arrived to unit via [facility] in stable condition. Initial VS done, paged hospital admissions for MD information.

## 2016-09-29 NOTE — H&P (Signed)
[facility] History and Physical  Maureen Hanna NFA:[id number] DOB: 09-Sep-1990 DOA: 09/29/2016  Referring physician: Dr. Anitra Lauth PCP: Patient, No Pcp Per   Chief Complaint: Leg swelling, hx ETOH abuse  HPI: Maureen Hanna is a 26 y.o. female with hx of etoh abuse presenting to ED today reporting new bilat LE edema.   Workup in ED shows new small pleural effusions on CXR, ^BNP, ^LFT's and bradycardia on EKG.  Asked to see for r/o new onset CHF.    Patient was just admitted here on 8/30 - 09/20/16 for intractable nausea and vomiting. N/V was felt to be due to alcohol abuse and pancreatitis.  She was rx with IVF"s, antiemetics and pain medication.  She improved and diet was advanced and pt dc'd home.  F/U PCP.    Pt has had progressive leg swelling for the last several days since dc from hospital.  Last night she had chest pressure which woke her from sleep.  It was worse lying flat and improved sitting up.  No other SOB , no cough, no hemoptysis.  No hx heart disease, DVT. Never in the hospital until the admit from last week.     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  Single, lives w/ her boyfriend, no children.  No chronic medical conditions.   Pt states she has been a heavy drinker since age 28 , = 10 yrs.  +fam hx of etoh abuse.  She drinks every day , not in the morning except on vacations.  Drinks beer or wine after work, liquor on the weekends.  Last year went 30 days w/o etoh with the assisstance of a "nerve pill" given to her from urgent care.  She does have a PCP.    ROS  no joint pain   no HA  no blurry vision  no rash  no diarrhea  no nausea/ vomiting  no dysuria  no difficulty voiding  no change in urine color    Past Medical History  Past Medical History:  Diagnosis Date  . Alcohol abuse   . Dehydration    Past Surgical History  History reviewed. No pertinent surgical history. Family History  Family History  Problem Relation Age of Onset  . Liver disease Neg Hx    Social History  reports that she has never smoked. She has never used smokeless tobacco. She reports that she drinks alcohol. She reports that she does not use drugs. Allergies No Known Allergies Home medications Prior to Admission medications   Medication Sig Start Date End Date Taking? Authorizing Provider  ciprofloxacin (CIPRO) 500 MG tablet Take 1 tablet (500 mg total) by mouth 2 (two) times daily. 09/19/16 09/29/16 Yes Kathlen Mody, MD  feeding supplement, ENSURE ENLIVE, (ENSURE ENLIVE) LIQD Take 237 mLs by mouth 3 (three) times daily between meals. 09/19/16  Yes Kathlen Mody, MD  folic acid (FOLVITE) 1 MG tablet Take 1 tablet (1 mg total) by mouth daily. 09/20/16  Yes Kathlen Mody, MD  metroNIDAZOLE (FLAGYL) 500 MG tablet Take 1 tablet (500 mg total) by mouth 3 (three) times daily. 09/19/16 09/29/16 Yes Kathlen Mody, MD  pantoprazole (PROTONIX) 40 MG tablet Take 1 tablet (40 mg total) by mouth daily. 09/19/16 09/29/16 Yes Kathlen Mody, MD  potassium chloride SA (K-DUR,KLOR-CON) 20 MEQ tablet Take 2 tablets (40 mEq total) by mouth 2 (two) times daily. 09/19/16  Yes  Kathlen ModyAkula, Vijaya, MD  thiamine 100 MG tablet Take 1 tablet (100 mg total) by mouth daily. 09/20/16  Yes Kathlen ModyAkula, Vijaya, MD   Liver Function Tests  Recent Labs Lab 09/29/16 1214  AST 121*  ALT 62*  ALKPHOS 79  BILITOT 1.0  PROT 6.5  ALBUMIN 3.4*   No results for input(s): LIPASE, AMYLASE in the last 168 hours. CBC  Recent Labs Lab 09/29/16 1214  WBC 11.5*  HGB 10.1*  HCT 28.8*  MCV 93.5  PLT 629*   Basic Metabolic Panel  Recent Labs Lab 09/29/16 1214  NA 138  K 3.4*  CL 103  CO2 25  GLUCOSE 88  BUN 7  CREATININE 0.44  CALCIUM 8.7*     Vitals:   09/29/16 1700 09/29/16 1702 09/29/16 1715 09/29/16 1844  BP: (!) 121/93 134/87  125/78  Pulse: (!) 115 61  (!) 48  Resp: (!) 26   (!) 27 18  Temp:    99.2 F (37.3 C)  TempSrc:    Oral  SpO2: (!) 87%   100%  Weight:    64.1 kg (141 lb 6.4 oz)  Height:    5\' 7"  (1.702 m)   Exam: Gen alert AAF, no distress No rash, cyanosis or gangrene Sclera anicteric, throat clear  +JVD Chest fine bibasilar rales RRR no MRG Abd soft ntnd no mass or ascites +bs GU defer MS no joint effusions or deformity Ext 1-2+ bilat pretib edema / no wounds or ulcers Neuro is alert, Ox 3 , nf    Home meds: -cipro 500 bid/ ensure tid/ folic acid/ PPI/ KCL/ thiamine   Na 138  K 3.4   CO2 25  BUN 7  Cr 0.44   Mg 1.6   Alb 3.4   AST 121 / ALT 62   Tbili 0.7  BNP 395   Trop < 0.03 WBC 11.5  Hb 10.2    GGT 1013 on 09/17/16  EKG (independ reviewed) >  Sinus bradycardia. HR 44 Borderline right axis deviation Borderline Q waves in lateral leads  CXR (independ reviewed) > New trace bilateral pleural effusions with adjacent bibasilar atelectasis.    Assessment: 1.  Leg edema - new onset w assoc CP/ pressure, new small pleural effusions on  CXR.  May have CHF. Long hx etoh abuse, heavier in the past than now per patient.  Plan admit, ECHO, IV lasix. 2.  ETOH abuse - CIWA protocol ordered. Will need complete etoh cessation, discussed with patient. She says he has really bad anxiety which is why she drinks.  May benefit from psych medication 3.  ^LFT's - prob etoh hepatitis, improved / down some from last week   Plan - as above      Maureen Hanna D [facility] Pager [phone]   If 7PM-7AM, please contact night-coverage [email or web address] Password [email or web address] 09/29/2016, 7:02 PM

## 2016-09-29 NOTE — ED Triage Notes (Signed)
Seen and admitted x 1` week ago for dehydration..  Now swelling os feet and ankles x 2 days

## 2016-09-29 NOTE — ED Notes (Signed)
Patient ambulated on room air HR 60-69, RR 12-18, SpO2 95-100%. Denies DOE, SOB.

## 2016-09-29 NOTE — ED Notes (Signed)
Attempted to call report x 1  

## 2016-09-30 ENCOUNTER — Inpatient Hospital Stay (HOSPITAL_COMMUNITY): Payer: BC Managed Care – PPO

## 2016-09-30 DIAGNOSIS — I340 Nonrheumatic mitral (valve) insufficiency: Secondary | ICD-10-CM

## 2016-09-30 DIAGNOSIS — R0601 Orthopnea: Secondary | ICD-10-CM

## 2016-09-30 DIAGNOSIS — I200 Unstable angina: Secondary | ICD-10-CM

## 2016-09-30 DIAGNOSIS — I509 Heart failure, unspecified: Secondary | ICD-10-CM

## 2016-09-30 LAB — ECHOCARDIOGRAM COMPLETE
HEIGHTINCHES: 67 in
Weight: 2198.4 oz

## 2016-09-30 LAB — BASIC METABOLIC PANEL
ANION GAP: 7 (ref 5–15)
BUN: <5 mg/dL — ABNORMAL LOW (ref 6–20)
CHLORIDE: 105 mmol/L (ref 101–111)
CO2: 26 mmol/L (ref 22–32)
Calcium: 8.2 mg/dL — ABNORMAL LOW (ref 8.9–10.3)
Creatinine, Ser: 0.6 mg/dL (ref 0.44–1.00)
GFR calc Af Amer: >60 mL/min (ref >60)
GFR calc non Af Amer: >60 mL/min (ref >60)
GLUCOSE: 86 mg/dL (ref 65–99)
POTASSIUM: 3.5 mmol/L (ref 3.5–5.1)
Sodium: 138 mmol/L (ref 135–145)

## 2016-09-30 MED ORDER — CHLORDIAZEPOXIDE HCL 5 MG PO CAPS
10.0000 mg | ORAL_CAPSULE | Freq: Three times a day (TID) | ORAL | Status: DC
  Administered 2016-09-30 – 2016-10-01 (×4): 10 mg via ORAL
  Filled 2016-09-30 (×4): qty 2

## 2016-09-30 NOTE — Progress Notes (Signed)
Echocardiogram 2D Echocardiogram has been performed.  Maureen PartridgeBrooke S Breeana Hanna 09/30/2016, 3:25 PM

## 2016-09-30 NOTE — Care Management Note (Signed)
Case Management Note  Patient Details  Name: Maureen Hanna MRN: [id number] Date of Birth: 1990/06/13  Subjective/Objective:       CHF            Action/Plan: Patient lives at home with her boyfriend; [facility] [phone]); apt made with Dr Deneen HartsElizabeth Todd on Sept 26, 2018 at 2 pm; has private insurance with BCBS with prescription drug coverage; pharmacy of choice is [facility]. CM talked to patient about ETOH; patient stated that she has decreased her drinking and has plans to stop. No needs identified at this time. CM will continue to follow for DCP.  Expected Discharge Date:    possibly 10/01/2016              Expected Discharge Plan:  Home/Self Care  Discharge planning Services  CM Consult, Follow-up appt scheduled  Status of Service:  In process, will continue to follow  Reola MosherChandler, Pio Eatherly L, RN,MHA,BSN [phone] 09/30/2016, 11:34 AM

## 2016-09-30 NOTE — Progress Notes (Signed)
 @        PROGRESS NOTE                                                                                                                                                                                                             Patient Demographics:    Maureen Hanna, is a 26 y.o. female, DOB - [birth year], WUJ:[id number]  Admit date - 09/29/2016   Admitting Physician Ozella Rocks, MD  Outpatient Primary MD for the patient is Patient, No Pcp Per  LOS - 1  Chief Complaint  Patient presents with  . Leg Pain    swelling       Brief Narrative   Maureen Hanna is a 26 y.o. female with hx of etoh abuse presenting to ED today reporting new bilat LE edema.   Workup in ED shows new small pleural effusions on CXR, ^BNP, ^LFT's and bradycardia on EKG.  Asked to see for r/o new onset CHF.     Subjective:    Maureen Hanna today has, No headache, No chest pain, No abdominal pain - No Nausea, No new weakness tingling or numbness, No Cough - Improving edema and SOB.     Assessment  & Plan :     1. Acute on chronic heart failure type unknown. No previous echocardiogram in chart. This likely is diastolic heart failure, echocardiogram pending, continue gentle diuresis, clinically better bradycardic hence cannot add beta blocker. Continue supportive care if better discharged tomorrow.  2. History of alcohol abuse and alcoholic hepatiti. Stable no acute issues counseled to quit alcohol, on CIWA protocol.    Diet : Diet Heart Room service appropriate? Yes; Fluid consistency: Thin; Fluid restriction: 1500 mL Fluid    Family Communication  :  None  Code Status : Full  Disposition Plan  :  TBD  Consults  :  None  Procedures  :    TTE  DVT Prophylaxis  :  Lovenox   Lab Results  Component Value Date   PLT 473 (H) 09/29/2016    Inpatient Medications  Scheduled Meds: . chlordiazePOXIDE  10 mg Oral TID  . enoxaparin (LOVENOX) injection  40 mg Subcutaneous Q24H  .  folic acid  1 mg Oral Daily  . furosemide  20 mg Intravenous Q12H  . LORazepam  0-4 mg Intravenous Q6H   Followed by  . [START ON 10/01/2016] LORazepam  0-4 mg Intravenous Q12H  . multivitamin with minerals  1 tablet Oral Daily  . potassium chloride SA  40 mEq Oral  BID  . sodium chloride flush  3 mL Intravenous Q12H  . thiamine  100 mg Oral Daily   Continuous Infusions: . sodium chloride     PRN Meds:.sodium chloride, acetaminophen, LORazepam **OR** LORazepam, ondansetron (ZOFRAN) IV, sodium chloride flush  Antibiotics  :    Anti-infectives    None         Objective:   Vitals:   09/29/16 1844 09/29/16 2010 09/30/16 0014 09/30/16 0536  BP: 125/78 126/82 124/80 121/72  Pulse: (!) 48 (!) 52 66 (!) 59  Resp: 18 17  18   Temp: 99.2 F (37.3 C) 100.1 F (37.8 C) 98.6 F (37 C) 99.3 F (37.4 C)  TempSrc: Oral Oral Oral Oral  SpO2: 100% 98% 93% 93%  Weight: 64.1 kg (141 lb 6.4 oz)   62.3 kg (137 lb 6.4 oz)  Height: 5\' 7"  (1.702 m)       Wt Readings from Last 3 Encounters:  09/30/16 62.3 kg (137 lb 6.4 oz)  09/17/16 51.3 kg (113 lb)  10/16/15 59.9 kg (132 lb)     Intake/Output Summary (Last 24 hours) at 09/30/16 1206 Last data filed at 09/30/16 1134  Gross per 24 hour  Intake              240 ml  Output             3925 ml  Net            -3685 ml     Physical Exam  Awake Alert, Oriented X 3, No new F.N deficits, Normal affect [place].AT,PERRAL Supple Neck,No JVD, No cervical lymphadenopathy appriciated.  Symmetrical Chest wall movement, Good air movement bilaterally, CTAB RRR,No Gallops,Rubs or new Murmurs, No Parasternal Heave +ve B.Sounds, Abd Soft, No tenderness, No organomegaly appriciated, No rebound - guarding or rigidity. No Cyanosis, Clubbing , trace edema, No new Rash or bruise     Data Review:    CBC  Recent Labs Lab 09/29/16 1214 09/29/16 2040  WBC 11.5* 9.7  HGB 10.1* 8.8*  HCT 28.8* 25.7*  PLT 629* 473*  MCV 93.5 93.5  MCH 32.8 32.0   MCHC 35.1 34.2  RDW 16.4* 16.4*    Chemistries   Recent Labs Lab 09/29/16 1214 09/29/16 2040 09/30/16 0208  NA 138  --  138  K 3.4*  --  3.5  CL 103  --  105  CO2 25  --  26  GLUCOSE 88  --  86  BUN 7  --  <5*  CREATININE 0.44 0.68 0.60  CALCIUM 8.7*  --  8.2*  MG 1.6*  --   --   AST 121*  --   --   ALT 62*  --   --   ALKPHOS 79  --   --   BILITOT 1.0  --   --    ------------------------------------------------------------------------------------------------------------------ No results for input(s): CHOL, HDL, LDLCALC, TRIG, CHOLHDL, LDLDIRECT in the last 72 hours.  No results found for: HGBA1C ------------------------------------------------------------------------------------------------------------------ No results for input(s): TSH, T4TOTAL, T3FREE, THYROIDAB in the last 72 hours.  Invalid input(s): FREET3 ------------------------------------------------------------------------------------------------------------------ No results for input(s): VITAMINB12, FOLATE, FERRITIN, TIBC, IRON, RETICCTPCT in the last 72 hours.  Coagulation profile No results for input(s): INR, PROTIME in the last 168 hours.  No results for input(s): DDIMER in the last 72 hours.  Cardiac Enzymes  Recent Labs Lab 09/29/16 1214  TROPONINI <0.03   ------------------------------------------------------------------------------------------------------------------    Component Value Date/Time   BNP 395.9 (H) 09/29/2016 1214  Micro Results No results found for this or any previous visit (from the past 240 hour(s)).  Radiology Reports Dg Chest 2 View  Result Date: 09/29/2016 CLINICAL DATA:  Chest tightness. EXAM: CHEST  2 VIEW COMPARISON:  September 17, 2016. FINDINGS: The cardiomediastinal silhouette is normal in size. Normal pulmonary vascularity. Trace bilateral pleural effusions with right greater than left bibasilar atelectasis. No consolidation or pneumothorax. No acute osseous  abnormality. IMPRESSION: New trace bilateral pleural effusions with adjacent bibasilar atelectasis. Electronically Signed   By: Obie Dredge M.D.   On: 09/29/2016 12:19   Dg Chest 2 View  Result Date: 09/17/2016 CLINICAL DATA:  Four-day history of cough, shortness of breath on exertion, nausea and vomiting, and dysuria. EXAM: CHEST  2 VIEW COMPARISON:  None. FINDINGS: Cardiomediastinal silhouette unremarkable. Lungs clear. Bronchovascular markings normal. Pulmonary vascularity normal. No visible pleural effusions. No pneumothorax. Slight thoracic dextroscoliosis. IMPRESSION: No acute cardiopulmonary disease. Electronically Signed   By: Hulan Saas M.D.   On: 09/17/2016 20:06   Ct Abdomen Pelvis W Contrast  Result Date: 09/18/2016 CLINICAL DATA:  Nausea vomiting EXAM: CT ABDOMEN AND PELVIS WITH CONTRAST TECHNIQUE: Multidetector CT imaging of the abdomen and pelvis was performed using the standard protocol following bolus administration of intravenous contrast. CONTRAST:  ISOVUE-300 IOPAMIDOL (ISOVUE-300) INJECTION 61% COMPARISON:  09/17/2016 FINDINGS: Lower chest: Lung bases demonstrate no acute consolidation or pleural effusion. Normal heart size. Hepatobiliary: Enlarged fatty liver measuring up to 20 cm. No focal abnormality. No calcified gallstone or biliary dilatation Pancreas: Unremarkable. No pancreatic ductal dilatation or surrounding inflammatory changes. Spleen: Normal in size without focal abnormality. Adrenals/Urinary Tract: Adrenal glands are unremarkable. Kidneys are normal, without renal calculi, focal lesion, or hydronephrosis. Bladder is unremarkable. Stomach/Bowel: Stomach is nonenlarged. No dilated small bowel. Suspected mild wall thickening of the ascending colon and proximal transverse colon. Small amount of fluid in the right paracolic gutter. Appendix within normal limits. Vascular/Lymphatic: No significant vascular findings are present. No enlarged abdominal or pelvic  lymph nodes. Reproductive: Uterus is unremarkable. 2.6 cm intermediate density lesion left adnexa. Focal water density in the right pelvis which appears contiguous with a tubular structure, suspect that this is a hydrosalpinx, dilated tube measures up to 14 mm. 4 cm possible cyst in the right ovary. Other: Small free fluid in the pelvis.  Negative for free air. Musculoskeletal: No acute or significant osseous findings. IMPRESSION: 1. Suspicion of mild colon wall thickening/colitis of the ascending and transverse colon. Negative appendix. 2. Enlarged fatty liver 3. Right-sided hydrosalpinx. Intermediate density left adnexal lesion which may be correlated with a nonemergent pelvic ultrasound. 4. Small amount of free fluid in the pelvis. Electronically Signed   By: Jasmine Pang M.D.   On: 09/18/2016 00:05   US Abdomen Limited Ruq  Result Date: 09/17/2016 CLINICAL DATA:  Vomiting and diarrhea for 4 days. EXAM: ULTRASOUND ABDOMEN LIMITED RIGHT UPPER QUADRANT COMPARISON:  None. FINDINGS: Gallbladder: No gallstones or wall thickening visualized. No sonographic Murphy sign noted by sonographer. Common bile duct: Diameter: 2.4 mm Liver: Generalized coarsening of hepatic parenchymal echotexture without discrete lesion. Portal vein is patent on color Doppler imaging with normal direction of blood flow towards the liver. IMPRESSION: Normal gallbladder and bile ducts. Coarsened hepatic parenchymal echotexture may represent fatty infiltration. Electronically Signed   By: Ellery Plunk M.D.   On: 09/17/2016 22:43    Time Spent in minutes  30   Susa Raring M.D on 09/30/2016 at 12:06 PM  Between 7am to 7pm - Pager -  [phone] ( page via [email or web address], text pages only, please mention full 10 digit call back number). After 7pm go to [email or web address] - password [email or web address]

## 2016-10-01 DIAGNOSIS — E8770 Fluid overload, unspecified: Principal | ICD-10-CM

## 2016-10-01 LAB — BASIC METABOLIC PANEL
ANION GAP: 7 (ref 5–15)
BUN: <5 mg/dL — ABNORMAL LOW (ref 6–20)
CO2: 27 mmol/L (ref 22–32)
Calcium: 8.5 mg/dL — ABNORMAL LOW (ref 8.9–10.3)
Chloride: 102 mmol/L (ref 101–111)
Creatinine, Ser: 0.46 mg/dL (ref 0.44–1.00)
GFR calc Af Amer: >60 mL/min (ref >60)
GLUCOSE: 83 mg/dL (ref 65–99)
POTASSIUM: 3.7 mmol/L (ref 3.5–5.1)
Sodium: 136 mmol/L (ref 135–145)

## 2016-10-01 LAB — URINALYSIS, ROUTINE W REFLEX MICROSCOPIC
Bilirubin Urine: NEGATIVE
GLUCOSE, UA: NEGATIVE mg/dL
Hgb urine dipstick: NEGATIVE
Ketones, ur: NEGATIVE mg/dL
LEUKOCYTES UA: NEGATIVE
Nitrite: NEGATIVE
PH: 8 (ref 5.0–8.0)
PROTEIN: NEGATIVE mg/dL
Specific Gravity, Urine: 1.016 (ref 1.005–1.030)

## 2016-10-01 NOTE — Discharge Instructions (Signed)
Follow with Primary MD and recommended Kidney doctor in 7 days   Get CBC, CMP, 2 view Chest X ray checked  by Primary MD or SNF MD in 5-7 days ( we routinely change or add medications that can affect your baseline labs and fluid status, therefore we recommend that you get the mentioned basic workup next visit with your PCP, your PCP may decide not to get them or add new tests based on their clinical decision)  Activity: As tolerated with Full fall precautions use walker/cane & assistance as needed  Disposition Home    Diet:    Heart Healthy   For Heart failure patients - Check your Weight same time everyday, if you gain over 2 pounds, or you develop in leg swelling, experience more shortness of breath or chest pain, call your Primary MD immediately. Follow Cardiac Low Salt Diet and 1.5 lit/day fluid restriction.  On your next visit with your primary care physician please Get Medicines reviewed and adjusted.  Please request your Prim.MD to go over all Hospital Tests and Procedure/Radiological results at the follow up, please get all Hospital records sent to your Prim MD by signing hospital release before you go home.  If you experience worsening of your admission symptoms, develop shortness of breath, life threatening emergency, suicidal or homicidal thoughts you must seek medical attention immediately by calling 911 or calling your MD immediately  if symptoms less severe.  You Must read complete instructions/literature along with all the possible adverse reactions/side effects for all the Medicines you take and that have been prescribed to you. Take any new Medicines after you have completely understood and accpet all the possible adverse reactions/side effects.   Do not drive, operate heavy machinery, perform activities at heights, swimming or participation in water activities or provide baby sitting services if your were admitted for syncope or siezures until you have seen by Primary MD or a  Neurologist and advised to do so again.  Do not drive when taking Pain medications.    Do not take more than prescribed Pain, Sleep and Anxiety Medications  Special Instructions: If you have smoked or chewed Tobacco  in the last 2 yrs please stop smoking, stop any regular Alcohol  and or any Recreational drug use.  Wear Seat belts while driving.   Please note  You were cared for by a hospitalist during your hospital stay. If you have any questions about your discharge medications or the care you received while you were in the hospital after you are discharged, you can call the unit and asked to speak with the hospitalist on call if the hospitalist that took care of you is not available. Once you are discharged, your primary care physician will handle any further medical issues. Please note that NO REFILLS for any discharge medications will be authorized once you are discharged, as it is imperative that you return to your primary care physician (or establish a relationship with a primary care physician if you do not have one) for your aftercare needs so that they can reassess your need for medications and monitor your lab values.                                                       Maureen LeylandShakira Hanna was admitted to the Hospital on 09/29/2016 and Discharged  10/01/2016 and should be excused from work/school   for 3  days starting from date -  09/29/2016 , may return to work/school without any restrictions.  Call Susa Raring MD, [facility]  [phone] with questions.  Susa Raring M.D on 10/01/2016,at 8:36 AM  [facility]   Office  [phone]

## 2016-10-01 NOTE — Progress Notes (Signed)
Pt wanted IV out due to pain. Pt now refusing IV access until after breakfast. Will continue to monitor.

## 2016-10-01 NOTE — Discharge Summary (Signed)
Maureen Hanna WUJ:[id number] DOB: 1990-12-26 DOA: 09/29/2016  PCP: Maureen Hanna  Admit date: 09/29/2016  Discharge date: 10/01/2016  Admitted From: Home  Disposition:  Home   Recommendations for Outpatient Follow-up:   Follow up with PCP in 1-2 weeks  PCP Please obtain BMP/CBC, 2 view CXR in 1week,  (see Discharge instructions)   PCP Please follow up on the following pending results: None   Home Health: None   Equipment/Devices: None  Consultations: None Discharge Condition: Stable   CODE STATUS: Full   Diet Recommendation:  Heart Healthy    Chief Complaint  Patient presents with  . Leg Pain    swelling     Brief history of present illness from the day of admission and additional interim summary    Maureen Carringtonis a 26 y.o.femalewith hx of etoh abuse presenting to ED today reporting new bilat LE edema. Workup in ED shows new small pleural effusions on CXR, ^BNP, ^LFT's and bradycardia on EKG. Asked to see for r/o new onset CHF.                                                                 Hospital Course    1. Simple fluid overload due to aggressive hydration when she was acutely sick and admitted to the hospital a few weeks ago, no CHF, echo shows preserved EF without any LVH with diastolic dysfunction. She was kept in the hospital and diuresed, Ted stockings were applied, her volume status is back to baseline, UA was unremarkable. She will be discharged home with outpatient PCP follow-up in one week.   2. History of alcohol abuse and H/O alcoholic hepatitis. Stable no acute issues counseled to quit alcohol, signs of DTs, continue Foley catheter and thiamine outpatient.   Discharge diagnosis     Principal Problem:   Acute congestive heart failure (HCC) Active Problems:  Alcohol abuse   Chest pain   Orthopnea    Discharge instructions    Discharge Instructions    Diet - low sodium heart healthy    Complete by:  As directed    Discharge instructions    Complete by:  As directed    Follow with Primary MD and recommended Kidney doctor in 7 days   Get CBC, CMP, 2 view Chest X ray checked  by Primary MD or SNF MD in 5-7 days ( we routinely change or add medications that can affect your baseline labs and fluid status, therefore we recommend that you get the mentioned basic workup next visit with your PCP, your PCP may decide not to get them or add new tests based on their clinical decision)  Activity: As tolerated with Full fall precautions use walker/cane & assistance as needed  Disposition Home    Diet:    Heart  Healthy   For Heart failure patients - Check your Weight same time everyday, if you gain over 2 pounds, or you develop in leg swelling, experience more shortness of breath or chest pain, call your Primary MD immediately. Follow Cardiac Low Salt Diet and 1.5 lit/day fluid restriction.  On your next visit with your primary care physician please Get Medicines reviewed and adjusted.  Please request your Prim.MD to go over all Hospital Tests and Procedure/Radiological results at the follow up, please get all Hospital records sent to your Prim MD by signing hospital release before you go home.  If you experience worsening of your admission symptoms, develop shortness of breath, life threatening emergency, suicidal or homicidal thoughts you must seek medical attention immediately by calling 911 or calling your MD immediately  if symptoms less severe.  You Must read complete instructions/literature along with all the possible adverse reactions/side effects for all the Medicines you take and that have been prescribed to you. Take any new Medicines after you have completely understood and accpet all the possible adverse reactions/side effects.   Do not  drive, operate heavy machinery, perform activities at heights, swimming or participation in water activities or provide baby sitting services if your were admitted for syncope or siezures until you have seen by Primary MD or a Neurologist and advised to do so again.  Do not drive when taking Pain medications.    Do not take more than prescribed Pain, Sleep and Anxiety Medications  Special Instructions: If you have smoked or chewed Tobacco  in the last 2 yrs please stop smoking, stop any regular Alcohol  and or any Recreational drug use.  Wear Seat belts while driving.   Please note  You were cared for by a hospitalist during your hospital stay. If you have any questions about your discharge medications or the care you received while you were in the hospital after you are discharged, you can call the unit and asked to speak with the hospitalist on call if the hospitalist that took care of you is not available. Once you are discharged, your primary care physician will handle any further medical issues. Please note that NO REFILLS for any discharge medications will be authorized once you are discharged, as it is imperative that you return to your primary care physician (or establish a relationship with a primary care physician if you do not have one) for your aftercare needs so that they can reassess your need for medications and monitor your lab values.                                                       Maureen Hanna was admitted to the Hospital on 09/29/2016 and Discharged  10/01/2016 and should be excused from work/school   for 3  days starting from date -  09/29/2016 , may return to work/school without any restrictions.  Call Maureen Raring MD, [facility]  [phone] with questions.  Maureen Hanna M.D on 10/01/2016,at 8:36 AM  [facility]   Office  [phone]   Increase activity slowly    Complete by:  As directed       Discharge Medications   Allergies  as of 10/01/2016   No Known Allergies     Medication List    STOP taking these medications   ciprofloxacin 500  MG tablet Commonly known as:  CIPRO   metroNIDAZOLE 500 MG tablet Commonly known as:  FLAGYL     TAKE these medications   feeding supplement (ENSURE ENLIVE) Liqd Take 237 mLs by mouth 3 (three) times daily between meals. What changed:  when to take this   folic acid 1 MG tablet Commonly known as:  FOLVITE Take 1 tablet (1 mg total) by mouth daily.   pantoprazole 40 MG tablet Commonly known as:  PROTONIX Take 1 tablet (40 mg total) by mouth daily.   potassium chloride SA 20 MEQ tablet Commonly known as:  K-DUR,KLOR-CON Take 2 tablets (40 mEq total) by mouth 2 (two) times daily.   thiamine 100 MG tablet Take 1 tablet (100 mg total) by mouth daily.            Discharge Care Instructions        Start     Ordered   10/01/16 0000  Increase activity slowly     10/01/16 0928   10/01/16 0000  Diet - low sodium heart healthy     10/01/16 0928   10/01/16 0000  Discharge instructions    Comments:  Follow with Primary MD and recommended Kidney doctor in 7 days   Get CBC, CMP, 2 view Chest X ray checked  by Primary MD or SNF MD in 5-7 days ( we routinely change or add medications that can affect your baseline labs and fluid status, therefore we recommend that you get the mentioned basic workup next visit with your PCP, your PCP may decide not to get them or add new tests based on their clinical decision)  Activity: As tolerated with Full fall precautions use walker/cane & assistance as needed  Disposition Home    Diet:    Heart Healthy   For Heart failure patients - Check your Weight same time everyday, if you gain over 2 pounds, or you develop in leg swelling, experience more shortness of breath or chest pain, call your Primary MD immediately. Follow Cardiac Low Salt Diet and 1.5 lit/day fluid restriction.  On your next visit with your primary care physician  please Get Medicines reviewed and adjusted.  Please request your Prim.MD to go over all Hospital Tests and Procedure/Radiological results at the follow up, please get all Hospital records sent to your Prim MD by signing hospital release before you go home.  If you experience worsening of your admission symptoms, develop shortness of breath, life threatening emergency, suicidal or homicidal thoughts you must seek medical attention immediately by calling 911 or calling your MD immediately  if symptoms less severe.  You Must read complete instructions/literature along with all the possible adverse reactions/side effects for all the Medicines you take and that have been prescribed to you. Take any new Medicines after you have completely understood and accpet all the possible adverse reactions/side effects.   Do not drive, operate heavy machinery, perform activities at heights, swimming or participation in water activities or provide baby sitting services if your were admitted for syncope or siezures until you have seen by Primary MD or a Neurologist and advised to do so again.  Do not drive when taking Pain medications.    Do not take more than prescribed Pain, Sleep and Anxiety Medications  Special Instructions: If you have smoked or chewed Tobacco  in the last 2 yrs please stop smoking, stop any regular Alcohol  and or any Recreational drug use.  Wear Seat belts while driving.   Please note  You were cared for by a hospitalist during your hospital stay. If you have any questions about your discharge medications or the care you received while you were in the hospital after you are discharged, you can call the unit and asked to speak with the hospitalist on call if the hospitalist that took care of you is not available. Once you are discharged, your primary care physician will handle any further medical issues. Please note that NO REFILLS for any discharge medications will be authorized once you  are discharged, as it is imperative that you return to your primary care physician (or establish a relationship with a primary care physician if you do not have one) for your aftercare needs so that they can reassess your need for medications and monitor your lab values.                                                       Maureen Hanna was admitted to the Hospital on 09/29/2016 and Discharged  10/01/2016 and should be excused from work/school   for 3  days starting from date -  09/29/2016 , may return to work/school without any restrictions.  Call Maureen RaringPrashant Lequita Meadowcroft MD, [facility]  [phone] with questions.  Maureen RaringPrashant Leeya Rusconi M.D on 10/01/2016,at 8:36 AM  [facility]   Office  [phone]   10/01/16 [phone]      Follow-up Information    Deneen Hartsodd, Elizabeth, FNP Follow up on 10/14/2016.   Specialty:  Nurse Practitioner Why:  at 2 pm; please try to keep your apt or call to reschedule Contact information: [address] [facility] [place] [address] [phone]           Major procedures and Radiology Reports - PLEASE review detailed and final reports thoroughly  -     TTE  - Left ventricle: The cavity size was normal. Wall thickness was   normal. Systolic function was normal. The estimated ejection fraction was in the range of 60% to 65%. Wall motion was normal; there were no regional wall motion abnormalities. There was no evidence of elevated ventricular filling pressure by Doppler parameters. - Mitral valve: There was mild regurgitation. - Tricuspid valve: There was trivial regurgitation. - Pulmonary arteries: Systolic pressure was moderately increased.  PA peak pressure: 53 mm Hg (S).   Dg Chest 2 View  Result Date: 09/29/2016 CLINICAL DATA:  Chest tightness. EXAM: CHEST  2 VIEW COMPARISON:  September 17, 2016. FINDINGS: The cardiomediastinal silhouette is normal in size. Normal pulmonary vascularity. Trace bilateral pleural effusions  with right greater than left bibasilar atelectasis. No consolidation or pneumothorax. No acute osseous abnormality. IMPRESSION: New trace bilateral pleural effusions with adjacent bibasilar atelectasis. Electronically Signed   By: Obie DredgeWilliam T Derry M.D.   On: 09/29/2016 12:19      Micro Results     No results found for this or any previous visit (from the past 240 hour(s)).  Today   Subjective    Maureen Hanna today has no headache,no chest abdominal pain,no new weakness tingling or numbness, feels much better wants to go home today.     Objective   Blood pressure 113/69, pulse (!) 56, temperature 98.6 F (37 C), temperature source Oral, resp. rate 16, height 5\' 7"  (1.702 m), weight 59.3 kg (130 lb 12.8 oz), last menstrual  period 09/16/2016, SpO2 98 %.   Intake/Output Summary (Last 24 hours) at 10/01/16 0928 Last data filed at 10/01/16 0540  Gross Hanna 24 hour  Intake              960 ml  Output             5100 ml  Net            -4140 ml    Exam Awake Alert, Oriented x 3, No new F.N deficits, Normal affect [place].AT,PERRAL Supple Neck,No JVD, No cervical lymphadenopathy appriciated.  Symmetrical Chest wall movement, Good air movement bilaterally, CTAB RRR,No Gallops,Rubs or new Murmurs, No Parasternal Heave +ve B.Sounds, Abd Soft, Non tender, No organomegaly appriciated, No rebound -guarding or rigidity. No Cyanosis, Clubbing or edema, No new Rash or bruise   Data Review   CBC w Diff: Lab Results  Component Value Date   WBC 9.7 09/29/2016   HGB 8.8 (L) 09/29/2016   HCT 25.7 (L) 09/29/2016   PLT 473 (H) 09/29/2016   LYMPHOPCT 9 09/17/2016   MONOPCT 15 09/17/2016   EOSPCT 0 09/17/2016   BASOPCT 0 09/17/2016    CMP: Lab Results  Component Value Date   NA 136 10/01/2016   K 3.7 10/01/2016   CL 102 10/01/2016   CO2 27 10/01/2016   BUN <5 (L) 10/01/2016   CREATININE 0.46 10/01/2016   PROT 6.5 09/29/2016   ALBUMIN 3.4 (L) 09/29/2016   BILITOT 1.0 09/29/2016    ALKPHOS 79 09/29/2016   AST 121 (H) 09/29/2016   ALT 62 (H) 09/29/2016  .   Total Time in preparing paper work, data evaluation and todays exam - 35 minutes  Maureen Hanna M.D on 10/01/2016 at 9:28 AM  [facility]   Office  [phone]

## 2016-10-01 NOTE — Progress Notes (Signed)
Patient provided discharge instructions including follow up appointment and medications. Patients instructed on when to notify her doctor for change in condition.

## 2018-02-21 ENCOUNTER — Encounter (HOSPITAL_BASED_OUTPATIENT_CLINIC_OR_DEPARTMENT_OTHER): Payer: Self-pay | Admitting: Emergency Medicine

## 2018-02-21 ENCOUNTER — Emergency Department (HOSPITAL_BASED_OUTPATIENT_CLINIC_OR_DEPARTMENT_OTHER): Payer: BC Managed Care – PPO

## 2018-02-21 ENCOUNTER — Emergency Department (HOSPITAL_BASED_OUTPATIENT_CLINIC_OR_DEPARTMENT_OTHER)
Admission: EM | Admit: 2018-02-21 | Discharge: 2018-02-21 | Disposition: A | Discharge status: Home / Self Care | Payer: BC Managed Care – PPO | Source: Home / Self Care | Attending: Emergency Medicine | Admitting: Emergency Medicine

## 2018-02-21 ENCOUNTER — Emergency Department (HOSPITAL_COMMUNITY)
Admission: EM | Admit: 2018-02-21 | Discharge: 2018-02-21 | Disposition: A | Discharge status: Home / Self Care | Payer: BC Managed Care – PPO | Attending: Emergency Medicine | Admitting: Emergency Medicine

## 2018-02-21 ENCOUNTER — Other Ambulatory Visit: Payer: Self-pay

## 2018-02-21 ENCOUNTER — Encounter (HOSPITAL_COMMUNITY): Payer: Self-pay | Admitting: *Deleted

## 2018-02-21 DIAGNOSIS — R740 Nonspecific elevation of levels of transaminase and lactic acid dehydrogenase [LDH]: Secondary | ICD-10-CM | POA: Insufficient documentation

## 2018-02-21 DIAGNOSIS — R112 Nausea with vomiting, unspecified: Secondary | ICD-10-CM | POA: Insufficient documentation

## 2018-02-21 DIAGNOSIS — R1110 Vomiting, unspecified: Secondary | ICD-10-CM

## 2018-02-21 DIAGNOSIS — R05 Cough: Secondary | ICD-10-CM | POA: Diagnosis not present

## 2018-02-21 DIAGNOSIS — Z5321 Procedure and treatment not carried out due to patient leaving prior to being seen by health care provider: Secondary | ICD-10-CM | POA: Diagnosis not present

## 2018-02-21 DIAGNOSIS — Z79899 Other long term (current) drug therapy: Secondary | ICD-10-CM | POA: Insufficient documentation

## 2018-02-21 DIAGNOSIS — I509 Heart failure, unspecified: Secondary | ICD-10-CM | POA: Insufficient documentation

## 2018-02-21 DIAGNOSIS — R7401 Elevation of levels of liver transaminase levels: Secondary | ICD-10-CM

## 2018-02-21 HISTORY — DX: Alcoholic hepatitis without ascites: K70.10

## 2018-02-21 LAB — BASIC METABOLIC PANEL
Anion gap: 19 — ABNORMAL HIGH (ref 5–15)
BUN: 6 mg/dL (ref 6–20)
CO2: 14 mmol/L — ABNORMAL LOW (ref 22–32)
Calcium: 8.5 mg/dL — ABNORMAL LOW (ref 8.9–10.3)
Chloride: 101 mmol/L (ref 98–111)
Creatinine, Ser: 0.7 mg/dL (ref 0.44–1.00)
GFR calc Af Amer: >60 mL/min (ref >60)
GFR calc non Af Amer: >60 mL/min (ref >60)
Glucose, Bld: 61 mg/dL — ABNORMAL LOW (ref 70–99)
Potassium: 4.8 mmol/L (ref 3.5–5.1)
Sodium: 134 mmol/L — ABNORMAL LOW (ref 135–145)

## 2018-02-21 LAB — LIPASE, BLOOD: LIPASE: 61 U/L — AB (ref 11–51)

## 2018-02-21 LAB — COMPREHENSIVE METABOLIC PANEL
ALK PHOS: 70 U/L (ref 38–126)
ALT: 98 U/L — AB (ref 0–44)
AST: 244 U/L — ABNORMAL HIGH (ref 15–41)
Albumin: 4.5 g/dL (ref 3.5–5.0)
Anion gap: 27 — ABNORMAL HIGH (ref 5–15)
BUN: 6 mg/dL (ref 6–20)
CALCIUM: 9.5 mg/dL (ref 8.9–10.3)
CHLORIDE: 95 mmol/L — AB (ref 98–111)
CO2: 15 mmol/L — ABNORMAL LOW (ref 22–32)
Creatinine, Ser: 1.06 mg/dL — ABNORMAL HIGH (ref 0.44–1.00)
Glucose, Bld: 84 mg/dL (ref 70–99)
Potassium: 4.2 mmol/L (ref 3.5–5.1)
Sodium: 137 mmol/L (ref 135–145)
Total Bilirubin: 1.9 mg/dL — ABNORMAL HIGH (ref 0.3–1.2)
Total Protein: 8.5 g/dL — ABNORMAL HIGH (ref 6.5–8.1)

## 2018-02-21 LAB — URINALYSIS, MICROSCOPIC (REFLEX)

## 2018-02-21 LAB — CBC
HEMATOCRIT: 42.7 % (ref 36.0–46.0)
Hemoglobin: 14.2 g/dL (ref 12.0–15.0)
MCH: 32.6 pg (ref 26.0–34.0)
MCHC: 33.3 g/dL (ref 30.0–36.0)
MCV: 97.9 fL (ref 80.0–100.0)
NRBC: 0 % (ref 0.0–0.2)
PLATELETS: 220 10*3/uL (ref 150–400)
RBC: 4.36 MIL/uL (ref 3.87–5.11)
RDW: 14.3 % (ref 11.5–15.5)
WBC: 6.3 10*3/uL (ref 4.0–10.5)

## 2018-02-21 LAB — URINALYSIS, ROUTINE W REFLEX MICROSCOPIC
GLUCOSE, UA: NEGATIVE mg/dL
HGB URINE DIPSTICK: NEGATIVE
Ketones, ur: >80 mg/dL — AB
Leukocytes, UA: NEGATIVE
Nitrite: NEGATIVE
PROTEIN: 30 mg/dL — AB
Specific Gravity, Urine: >1.03 — ABNORMAL HIGH (ref 1.005–1.030)
pH: 6 (ref 5.0–8.0)

## 2018-02-21 LAB — ACETAMINOPHEN LEVEL

## 2018-02-21 LAB — LACTIC ACID, PLASMA: LACTIC ACID, VENOUS: 1.1 mmol/L (ref 0.5–1.9)

## 2018-02-21 LAB — I-STAT BETA HCG BLOOD, ED (MC, WL, AP ONLY): I-stat hCG, quantitative: <5 m[IU]/mL (ref <5)

## 2018-02-21 LAB — SALICYLATE LEVEL: Salicylate Lvl: <7 mg/dL (ref 2.8–30.0)

## 2018-02-21 MED ORDER — ONDANSETRON HCL 4 MG/2ML IJ SOLN
4.0000 mg | Freq: Once | INTRAMUSCULAR | Status: AC
  Administered 2018-02-21: 4 mg via INTRAVENOUS
  Filled 2018-02-21: qty 2

## 2018-02-21 MED ORDER — PROMETHAZINE HCL 25 MG/ML IJ SOLN
25.0000 mg | Freq: Once | INTRAMUSCULAR | Status: AC
  Administered 2018-02-21: 25 mg via INTRAVENOUS

## 2018-02-21 MED ORDER — SODIUM CHLORIDE 0.9 % IV BOLUS
1000.0000 mL | Freq: Once | INTRAVENOUS | Status: AC
  Administered 2018-02-21: 1000 mL via INTRAVENOUS

## 2018-02-21 MED ORDER — PROMETHAZINE HCL 25 MG PO TABS: 25.0000 mg | ORAL_TABLET | Freq: Four times a day (QID) | ORAL | 0 refills | Status: AC | PRN

## 2018-02-21 MED ORDER — SODIUM CHLORIDE 0.9% FLUSH: 3.0000 mL | Freq: Once | INTRAVENOUS | Status: DC

## 2018-02-21 MED ORDER — IBUPROFEN 600 MG PO TABS: 600.0000 mg | ORAL_TABLET | Freq: Four times a day (QID) | ORAL | 0 refills | Status: DC | PRN

## 2018-02-21 MED ORDER — DIPHENHYDRAMINE HCL 50 MG/ML IJ SOLN
12.5000 mg | Freq: Once | INTRAMUSCULAR | Status: AC
  Administered 2018-02-21: 12.5 mg via INTRAVENOUS
  Filled 2018-02-21: qty 1

## 2018-02-21 MED ORDER — PROMETHAZINE HCL 25 MG/ML IJ SOLN
INTRAMUSCULAR | Status: AC
  Filled 2018-02-21: qty 1

## 2018-02-21 NOTE — ED Triage Notes (Signed)
Pt in c/o cough and congestion for the last two days, also episode of n/v during the night, no distress noted

## 2018-02-21 NOTE — Discharge Instructions (Addendum)
It was my pleasure taking care of you today!   It is very important that you call your primary care doctor tomorrow to schedule a follow up appointment this week. You will need your blood work rechecked this week to make sure it is continuing to improve.   Phenergan as needed for nausea, vomiting.  Increase hydration.  Please return to the ER if you begin having persistent vomiting and unable to keep fluids down again. You should also return to ER for fever, worsening abdominal pain, new or worsening symptoms, any additional concerns.

## 2018-02-21 NOTE — ED Notes (Signed)
Pt given water and ginger ale for PO challenge. 

## 2018-02-21 NOTE — ED Notes (Signed)
ED Provider at bedside. 

## 2018-02-21 NOTE — ED Triage Notes (Addendum)
Pt cough and congestion also with n/v for 2 days, denies abdominal pain or fevers.   lab work completed at [facility] but did not stay due to the wait.

## 2018-02-21 NOTE — ED Provider Notes (Signed)
[facility] EMERGENCY DEPARTMENT Provider Note   CSN: [id number] Arrival date & time: 02/21/18  1431     History   Chief Complaint Chief Complaint  Patient presents with  . Nausea  . Emesis    HPI Maureen Hanna is a 28 y.o. female.  The history is provided by the patient and medical records. No language interpreter was used.     Maureen Hanna is a 28 y.o. female who presents to the Emergency Department complaining of nausea and vomiting for the last 2 days.  She reports that she has been unable to tolerate any p.o. although today.  She has thrown up every time she tries to eat or drink anything and reports last episode of emesis about 10 minutes ago in the waiting room.  She denies any abdominal pain.  No diarrhea, constipation, fever or chills.  No known sick contacts.  No urinary symptoms or vaginal discharge.  No chest pain or shortness of breath.  She reports a history similar that a year and a half ago when was admitted to the hospital.  She states that she was told this was due to her alcohol use.  She used to be a heavy daily drinker, but has now cut down and only drinks on the weekends.  She does report that she drinks this week but not more than a couple of drinks.  Past Medical History:  Diagnosis Date  . Alcohol abuse   . Alcoholic hepatitis   . CHF (congestive heart failure) (HCC)   . Dehydration   . GERD (gastroesophageal reflux disease)   . Sleep apnea    "mask hasn't been ordered" (09/29/2016)    Patient Active Problem List   Diagnosis Date Noted  . Acute congestive heart failure (HCC) 09/29/2016  . Chest pain 09/29/2016  . Orthopnea 09/29/2016  . Intractable nausea and vomiting 09/18/2016  . Acidosis 09/18/2016  . Colitis 09/18/2016  . Elevated liver enzymes 09/18/2016  . Hyponatremia 09/18/2016  . Hypokalemia 09/18/2016  . Alcohol abuse 09/18/2016    Past Surgical History:  Procedure Laterality Date  . NO PAST SURGERIES        OB History   No obstetric history on file.      Home Medications    Prior to Admission medications   Medication Sig Start Date End Date Taking? Authorizing Provider  feeding supplement, ENSURE ENLIVE, (ENSURE ENLIVE) LIQD Take 237 mLs by mouth 3 (three) times daily between meals. Patient taking differently: Take 237 mLs by mouth daily.  09/19/16   Kathlen Mody, MD  folic acid (FOLVITE) 1 MG tablet Take 1 tablet (1 mg total) by mouth daily. 09/20/16   Kathlen Mody, MD  ibuprofen (ADVIL,MOTRIN) 600 MG tablet Take 1 tablet (600 mg total) by mouth every 6 (six) hours as needed. 02/21/18   Ward, Chase Picket, PA-C  pantoprazole (PROTONIX) 40 MG tablet Take 1 tablet (40 mg total) by mouth daily. 09/19/16 09/30/16  Kathlen Mody, MD  potassium chloride SA (K-DUR,KLOR-CON) 20 MEQ tablet Take 2 tablets (40 mEq total) by mouth 2 (two) times daily. Patient not taking: Reported on 09/30/2016 09/19/16   Kathlen Mody, MD  promethazine (PHENERGAN) 25 MG tablet Take 1 tablet (25 mg total) by mouth every 6 (six) hours as needed for nausea. 02/21/18   Ward, Chase Picket, PA-C  thiamine 100 MG tablet Take 1 tablet (100 mg total) by mouth daily. 09/20/16   Kathlen Mody, MD    Family History Family History  Problem  Relation Age of Onset  . Liver disease Neg Hx     Social History Social History   Tobacco Use  . Smoking status: Never Smoker  . Smokeless tobacco: Never Used  Substance Use Topics  . Alcohol use: Yes    Alcohol/week: 12.0 standard drinks    Types: 7 Glasses of wine, 5 Shots of liquor per week  . Drug use: No     Allergies   Patient has no known allergies.   Review of Systems Review of Systems  Gastrointestinal: Positive for abdominal pain, nausea and vomiting. Negative for blood in stool, constipation and diarrhea.  All other systems reviewed and are negative.    Physical Exam Updated Vital Signs BP (!) 119/94 (BP Location: Left Arm)   Pulse 83   Temp 99.9 F (37.7 C)  (Oral)   Resp 16   LMP 02/14/2018   SpO2 99%   Physical Exam Vitals signs and nursing note reviewed.  Constitutional:      General: She is not in acute distress.    Appearance: She is well-developed.  HENT:     Head: Normocephalic and atraumatic.  Neck:     Musculoskeletal: Neck supple.  Cardiovascular:     Rate and Rhythm: Normal rate and regular rhythm.     Heart sounds: Normal heart sounds. No murmur.  Pulmonary:     Effort: Pulmonary effort is normal. No respiratory distress.     Breath sounds: Normal breath sounds.  Abdominal:     General: There is no distension.     Palpations: Abdomen is soft.     Comments: Mild RUQ tenderness without rebound or guarding.   Skin:    General: Skin is warm and dry.  Neurological:     Mental Status: She is alert and oriented to person, place, and time.      ED Treatments / Results  Labs (all labs ordered are listed, but only abnormal results are displayed) Labs Reviewed  URINALYSIS, ROUTINE W REFLEX MICROSCOPIC - Abnormal; Notable for the following components:      Result Value   Specific Gravity, Urine >1.030 (*)    Bilirubin Urine SMALL (*)    Ketones, ur >80 (*)    Protein, ur 30 (*)    All other components within normal limits  URINALYSIS, MICROSCOPIC (REFLEX) - Abnormal; Notable for the following components:   Bacteria, UA FEW (*)    All other components within normal limits  ACETAMINOPHEN LEVEL - Abnormal; Notable for the following components:   Acetaminophen (Tylenol), Serum <10 (*)    All other components within normal limits  BASIC METABOLIC PANEL - Abnormal; Notable for the following components:   Sodium 134 (*)    CO2 14 (*)    Glucose, Bld 61 (*)    Calcium 8.5 (*)    Anion gap 19 (*)    All other components within normal limits  SALICYLATE LEVEL  LACTIC ACID, PLASMA  HEPATITIS PANEL, ACUTE  I-STAT CG4 LACTIC ACID, ED    EKG None  Radiology US Abdomen Limited Ruq  Result Date: 02/21/2018 CLINICAL  DATA:  Vomiting. EXAM: ULTRASOUND ABDOMEN LIMITED RIGHT UPPER QUADRANT COMPARISON:  Abdomen and pelvis CT dated 09/17/2016 and limited right upper quadrant abdomen ultrasound dated 09/17/2016. FINDINGS: Gallbladder: No gallstones or wall thickening visualized. No sonographic Murphy sign noted by sonographer. Common bile duct: Diameter: 3.6 mm Liver: Diffusely echogenic, without significant change. Corresponding low density on the previous CT. Portal vein is patent on color Doppler imaging  with normal direction of blood flow towards the liver. IMPRESSION: 1. No acute abnormality. 2. Stable diffuse hepatic steatosis. Electronically Signed   By: Beckie SaltsSteven  Reid M.D.   On: 02/21/2018 17:41    Procedures Procedures (including critical care time)  Medications Ordered in ED Medications  sodium chloride 0.9 % bolus 1,000 mL ( Intravenous Stopped 02/21/18 1750)  ondansetron (ZOFRAN) injection 4 mg (4 mg Intravenous Given 02/21/18 1637)  sodium chloride 0.9 % bolus 1,000 mL (0 mLs Intravenous Stopped 02/21/18 1926)  diphenhydrAMINE (BENADRYL) injection 12.5 mg (12.5 mg Intravenous Given 02/21/18 1827)  promethazine (PHENERGAN) injection 25 mg (25 mg Intravenous Given 02/21/18 1827)     Initial Impression / Assessment and Plan / ED Course  I have reviewed the triage vital signs and the nursing notes.  Pertinent labs & imaging results that were available during my care of the patient were reviewed by me and considered in my medical decision making (see chart for details).    Thermon LeylandShakira Gallogly is a 28 y.o. female who presents to ED for n/v and inability to tolerate p.o. for the last 2 days.  On exam, patient does have some tenderness to the right upper quadrant, but no rebound or guarding.  She is afebrile.  Mildly tachycardic, but regular.  Labs were performed at outside hospital, however she left prior to being seen.  Labs independently reviewed and notable for lipase of 61, creatinine 1.06, AST of 244, ALT of 98,  CO2 of 15 and anion gap of 27.  Urinalysis performed here without any signs of infection, but does have significant amount of ketones or protein.  Right upper quadrant ultrasound reassuring. Per chart review, she had a similar constellation of symptoms and lab results in August 2018 where she was admitted to the hospital for high anion gap metabolic acidosis with elevated liver function tests.  Thought to be secondary to alcohol abuse.  Patient has cut down quite significantly on her drinking, but does admit to using alcohol frequently on the weekends.  Will hydrate, provide antiemetics, repeat BMP and reassess.  Hepatitis panel sent as well.  Repeat BMP with much improved anion gap at 19.  CO2 14.  On reevaluation, patient feels much improved.  She has had no episodes of emesis in the several hours she has been in the emergency department room following nausea medication.  Abdominal exam much improved.  She is eating crackers and drinking ginger ale without any return of her nausea/vomiting.  She would like to go home.  We stressed the importance of following up with her primary care doctor this week for repeat lab work and reevaluation.  I spoke with her at length about reasons to return to the emergency department.  She understands and agrees with the plan as dictated above and all questions were answered.  Patient discussed with Dr. Clayborne DanaMesner who agrees with treatment plan.     Final Clinical Impressions(s) / ED Diagnoses   Final diagnoses:  Vomiting  Elevated AST (SGOT)  Elevated ALT measurement    ED Discharge Orders         Ordered    promethazine (PHENERGAN) 25 MG tablet  Every 6 hours PRN     02/21/18 2004    ibuprofen (ADVIL,MOTRIN) 600 MG tablet  Every 6 hours PRN     02/21/18 2004           Ward, Chase PicketJaime Pilcher, PA-C 02/21/18 2009    Marily MemosMesner, Jason, MD 02/22/18 [phone]

## 2018-02-22 LAB — HEPATITIS PANEL, ACUTE
HCV Ab: <0.1 s/co ratio (ref 0.0–0.9)
HEP B S AG: NEGATIVE
Hep A IgM: NEGATIVE
Hep B C IgM: NEGATIVE

## 2018-05-30 ENCOUNTER — Emergency Department (HOSPITAL_BASED_OUTPATIENT_CLINIC_OR_DEPARTMENT_OTHER): Payer: BC Managed Care – PPO

## 2018-05-30 ENCOUNTER — Encounter (HOSPITAL_BASED_OUTPATIENT_CLINIC_OR_DEPARTMENT_OTHER): Payer: Self-pay | Admitting: *Deleted

## 2018-05-30 ENCOUNTER — Emergency Department (HOSPITAL_BASED_OUTPATIENT_CLINIC_OR_DEPARTMENT_OTHER)
Admission: EM | Admit: 2018-05-30 | Discharge: 2018-05-30 | Disposition: A | Discharge status: Home / Self Care | Payer: BC Managed Care – PPO | Attending: Emergency Medicine | Admitting: Emergency Medicine

## 2018-05-30 ENCOUNTER — Other Ambulatory Visit: Payer: Self-pay

## 2018-05-30 DIAGNOSIS — E8809 Other disorders of plasma-protein metabolism, not elsewhere classified: Secondary | ICD-10-CM

## 2018-05-30 DIAGNOSIS — I509 Heart failure, unspecified: Secondary | ICD-10-CM | POA: Diagnosis not present

## 2018-05-30 DIAGNOSIS — G8929 Other chronic pain: Secondary | ICD-10-CM

## 2018-05-30 DIAGNOSIS — R770 Abnormality of albumin: Secondary | ICD-10-CM | POA: Diagnosis not present

## 2018-05-30 DIAGNOSIS — Z79899 Other long term (current) drug therapy: Secondary | ICD-10-CM | POA: Insufficient documentation

## 2018-05-30 DIAGNOSIS — E876 Hypokalemia: Secondary | ICD-10-CM | POA: Insufficient documentation

## 2018-05-30 DIAGNOSIS — M25562 Pain in left knee: Secondary | ICD-10-CM | POA: Insufficient documentation

## 2018-05-30 DIAGNOSIS — N12 Tubulo-interstitial nephritis, not specified as acute or chronic: Secondary | ICD-10-CM

## 2018-05-30 DIAGNOSIS — M25561 Pain in right knee: Secondary | ICD-10-CM | POA: Insufficient documentation

## 2018-05-30 DIAGNOSIS — N10 Acute tubulo-interstitial nephritis: Secondary | ICD-10-CM | POA: Insufficient documentation

## 2018-05-30 DIAGNOSIS — R079 Chest pain, unspecified: Secondary | ICD-10-CM | POA: Diagnosis present

## 2018-05-30 LAB — CBC WITH DIFFERENTIAL/PLATELET
Abs Immature Granulocytes: 0.18 10*3/uL — ABNORMAL HIGH (ref 0.00–0.07)
Basophils Absolute: 0 10*3/uL (ref 0.0–0.1)
Basophils Relative: 0 %
Eosinophils Absolute: 0 10*3/uL (ref 0.0–0.5)
Eosinophils Relative: 0 %
HCT: 32.5 % — ABNORMAL LOW (ref 36.0–46.0)
Hemoglobin: 11.8 g/dL — ABNORMAL LOW (ref 12.0–15.0)
Immature Granulocytes: 2 %
Lymphocytes Relative: 12 %
Lymphs Abs: 1.4 10*3/uL (ref 0.7–4.0)
MCH: 34.1 pg — ABNORMAL HIGH (ref 26.0–34.0)
MCHC: 36.3 g/dL — ABNORMAL HIGH (ref 30.0–36.0)
MCV: 93.9 fL (ref 80.0–100.0)
Monocytes Absolute: 2.6 10*3/uL — ABNORMAL HIGH (ref 0.1–1.0)
Monocytes Relative: 22 %
Neutro Abs: 7.3 10*3/uL (ref 1.7–7.7)
Neutrophils Relative %: 64 %
Platelets: 267 10*3/uL (ref 150–400)
RBC: 3.46 MIL/uL — ABNORMAL LOW (ref 3.87–5.11)
RDW: 14.7 % (ref 11.5–15.5)
WBC: 11.5 10*3/uL — ABNORMAL HIGH (ref 4.0–10.5)
nRBC: 0 % (ref 0.0–0.2)

## 2018-05-30 LAB — COMPREHENSIVE METABOLIC PANEL
ALT: 35 U/L (ref 0–44)
AST: 55 U/L — ABNORMAL HIGH (ref 15–41)
Albumin: 2.8 g/dL — ABNORMAL LOW (ref 3.5–5.0)
Alkaline Phosphatase: 74 U/L (ref 38–126)
Anion gap: 13 (ref 5–15)
BUN: 5 mg/dL — ABNORMAL LOW (ref 6–20)
CO2: 28 mmol/L (ref 22–32)
Calcium: 7.9 mg/dL — ABNORMAL LOW (ref 8.9–10.3)
Chloride: 93 mmol/L — ABNORMAL LOW (ref 98–111)
Creatinine, Ser: 0.61 mg/dL (ref 0.44–1.00)
GFR calc Af Amer: >60 mL/min (ref >60)
GFR calc non Af Amer: >60 mL/min (ref >60)
Glucose, Bld: 95 mg/dL (ref 70–99)
Potassium: 3.1 mmol/L — ABNORMAL LOW (ref 3.5–5.1)
Sodium: 134 mmol/L — ABNORMAL LOW (ref 135–145)
Total Bilirubin: 2.1 mg/dL — ABNORMAL HIGH (ref 0.3–1.2)
Total Protein: 6.2 g/dL — ABNORMAL LOW (ref 6.5–8.1)

## 2018-05-30 LAB — TROPONIN I: Troponin I: <0.03 ng/mL (ref <0.03)

## 2018-05-30 LAB — LACTIC ACID, PLASMA: Lactic Acid, Venous: 1.5 mmol/L (ref 0.5–1.9)

## 2018-05-30 LAB — URINALYSIS, MICROSCOPIC (REFLEX): WBC, UA: >50 WBC/hpf (ref 0–5)

## 2018-05-30 LAB — URINALYSIS, ROUTINE W REFLEX MICROSCOPIC
Glucose, UA: 100 mg/dL — AB
Ketones, ur: 15 mg/dL — AB
Nitrite: POSITIVE — AB
Protein, ur: 100 mg/dL — AB
Specific Gravity, Urine: 1.01 (ref 1.005–1.030)
pH: 6.5 (ref 5.0–8.0)

## 2018-05-30 LAB — PREGNANCY, URINE: Preg Test, Ur: NEGATIVE

## 2018-05-30 MED ORDER — SODIUM CHLORIDE 0.9 % IV SOLN
1.0000 g | Freq: Once | INTRAVENOUS | Status: AC
  Administered 2018-05-30: 06:00:00 1 g via INTRAVENOUS
  Filled 2018-05-30: qty 10

## 2018-05-30 MED ORDER — CEPHALEXIN 500 MG PO CAPS: 500.0000 mg | ORAL_CAPSULE | Freq: Three times a day (TID) | ORAL | 0 refills | Status: DC

## 2018-05-30 MED ORDER — KETOROLAC TROMETHAMINE 30 MG/ML IJ SOLN
30.0000 mg | Freq: Once | INTRAMUSCULAR | Status: AC
  Administered 2018-05-30: 06:00:00 30 mg via INTRAVENOUS
  Filled 2018-05-30: qty 1

## 2018-05-30 MED ORDER — POTASSIUM CHLORIDE CRYS ER 20 MEQ PO TBCR: 40.0000 meq | EXTENDED_RELEASE_TABLET | Freq: Every day | ORAL | 0 refills | Status: DC

## 2018-05-30 NOTE — ED Notes (Signed)
MD with pt  

## 2018-05-30 NOTE — ED Provider Notes (Signed)
[facility] EMERGENCY DEPARTMENT Provider Note   CSN: [id number] Arrival date & time: 05/30/18  0415    History   Chief Complaint Chief Complaint  Patient presents with  . knee swelling    HPI Maureen Hanna is a 28 y.o. female.     HPI  This is a 28 year old female with a history of alcoholic hepatitis, alcohol abuse, reflux who presents with chest discomfort.  Patient reports that she woke up this morning and "I felt like my bra was too tight but I was not wearing a bra."  She denies any pain but just reports pressure.  Does not seem to be worsened with breathing or exertion.  Of note, patient reports that she has had 1 month history of waxing and waning bilateral knee pain left greater than right.  She states at times her knees feel tight and she has difficulty with range of motion.  She reports tingling into her bilateral calves and feet.  She was seen and evaluated at urgent care on Friday for this.  She also reports dysuria.  Had not noted any fevers at home but noted to have a temperature of 101.6.  She denies any cough, shortness of breath, abdominal pain, nausea, vomiting, upper respiratory symptoms.  She denies any back pain or flank pain.  She reports that they did lab work urgent care but "I am supposed to call my doctor today for the results."  She reports that she was not given any medications.  I reviewed her chart.  It appears that she was noted to have low potassium and likely had a UTI based on urinalysis from her outside urgent care visit.  Patient reports that she did not know these results.  It also appears that she was prescribed Macrobid and potassium but again patient denies receiving any prescriptions.  Past Medical History:  Diagnosis Date  . Alcohol abuse   . Alcoholic hepatitis   . CHF (congestive heart failure) (HCC)   . Dehydration   . GERD (gastroesophageal reflux disease)   . Sleep apnea    "mask hasn't been ordered" (09/29/2016)     Patient Active Problem List   Diagnosis Date Noted  . Acute congestive heart failure (HCC) 09/29/2016  . Chest pain 09/29/2016  . Orthopnea 09/29/2016  . Intractable nausea and vomiting 09/18/2016  . Acidosis 09/18/2016  . Colitis 09/18/2016  . Elevated liver enzymes 09/18/2016  . Hyponatremia 09/18/2016  . Hypokalemia 09/18/2016  . Alcohol abuse 09/18/2016    Past Surgical History:  Procedure Laterality Date  . NO PAST SURGERIES       OB History   No obstetric history on file.      Home Medications    Prior to Admission medications   Medication Sig Start Date End Date Taking? Authorizing Provider  cephALEXin (KEFLEX) 500 MG capsule Take 1 capsule (500 mg total) by mouth 3 (three) times daily. 05/30/18   Dallie Patton, Mayer Maskerourtney F, MD  feeding supplement, ENSURE ENLIVE, (ENSURE ENLIVE) LIQD Take 237 mLs by mouth 3 (three) times daily between meals. Patient taking differently: Take 237 mLs by mouth daily.  09/19/16   Kathlen ModyAkula, Vijaya, MD  folic acid (FOLVITE) 1 MG tablet Take 1 tablet (1 mg total) by mouth daily. 09/20/16   Kathlen ModyAkula, Vijaya, MD  ibuprofen (ADVIL,MOTRIN) 600 MG tablet Take 1 tablet (600 mg total) by mouth every 6 (six) hours as needed. 02/21/18   Ward, Chase PicketJaime Pilcher, PA-C  pantoprazole (PROTONIX) 40 MG tablet Take 1 tablet (  40 mg total) by mouth daily. 09/19/16 09/30/16  Kathlen Mody, MD  potassium chloride SA (K-DUR) 20 MEQ tablet Take 2 tablets (40 mEq total) by mouth daily. 05/30/18   Adellyn Capek, Mayer Masker, MD  promethazine (PHENERGAN) 25 MG tablet Take 1 tablet (25 mg total) by mouth every 6 (six) hours as needed for nausea. 02/21/18   Ward, Chase Picket, PA-C  thiamine 100 MG tablet Take 1 tablet (100 mg total) by mouth daily. 09/20/16   Kathlen Mody, MD    Family History Family History  Problem Relation Age of Onset  . Liver disease Neg Hx     Social History Social History   Tobacco Use  . Smoking status: Never Smoker  . Smokeless tobacco: Never Used  Substance Use Topics   . Alcohol use: Yes    Alcohol/week: 2.0 standard drinks    Types: 2 Glasses of wine per week    Comment: last drink 2 days ago. "2 glasses of wine"  . Drug use: No     Allergies   Patient has no known allergies.   Review of Systems Review of Systems  Constitutional: Positive for fever.  HENT: Negative for ear pain and rhinorrhea.   Respiratory: Positive for chest tightness. Negative for cough and shortness of breath.   Cardiovascular: Positive for leg swelling. Negative for chest pain and palpitations.  Gastrointestinal: Negative for abdominal pain, nausea and vomiting.  Genitourinary: Positive for dysuria. Negative for difficulty urinating and flank pain.  Musculoskeletal: Negative for back pain.       Knee pain  Skin: Negative for rash.  Neurological: Negative for dizziness.  All other systems reviewed and are negative.    Physical Exam Updated Vital Signs BP 105/68   Pulse 78   Temp (!) 101.9 F (38.8 C)   Resp (!) 27   Ht 1.702 m ( )   Wt 61.7 kg   LMP 05/24/2018   SpO2 99%   BMI 21.30 kg/m   Physical Exam Vitals signs and nursing note reviewed.  Constitutional:      Appearance: She is well-developed. She is not ill-appearing.  HENT:     Head: Normocephalic and atraumatic.     Nose: No congestion.  Eyes:     Pupils: Pupils are equal, round, and reactive to light.  Neck:     Musculoskeletal: Neck supple.  Cardiovascular:     Rate and Rhythm: Regular rhythm. Tachycardia present.     Heart sounds: Normal heart sounds. No murmur.  Pulmonary:     Effort: Pulmonary effort is normal. No respiratory distress.     Breath sounds: No wheezing.  Abdominal:     General: Bowel sounds are normal. There is no distension.     Palpations: Abdomen is soft.     Tenderness: There is no abdominal tenderness.     Hernia: No hernia is present.  Musculoskeletal:        General: No swelling.     Right lower leg: No edema.     Left lower leg: No edema.     Comments:  Normal range of motion of the bilateral knees, no significant effusions noted, no overlying skin changes, no tenderness to palpation of the bilateral calves without significant edema, 2+ DP pulses bilaterally  Skin:    General: Skin is warm and dry.  Neurological:     Mental Status: She is alert and oriented to person, place, and time.  Psychiatric:        Mood and Affect:  Mood normal.      ED Treatments / Results  Labs (all labs ordered are listed, but only abnormal results are displayed) Labs Reviewed  CBC WITH DIFFERENTIAL/PLATELET - Abnormal; Notable for the following components:      Result Value   WBC 11.5 (*)    RBC 3.46 (*)    Hemoglobin 11.8 (*)    HCT 32.5 (*)    MCH 34.1 (*)    MCHC 36.3 (*)    Monocytes Absolute 2.6 (*)    Abs Immature Granulocytes 0.18 (*)    All other components within normal limits  URINALYSIS, ROUTINE W REFLEX MICROSCOPIC - Abnormal; Notable for the following components:   Color, Urine AMBER (*)    APPearance CLOUDY (*)    Glucose, UA 100 (*)    Hgb urine dipstick TRACE (*)    Bilirubin Urine MODERATE (*)    Ketones, ur 15 (*)    Protein, ur 100 (*)    Nitrite POSITIVE (*)    Leukocytes,Ua LARGE (*)    All other components within normal limits  URINALYSIS, MICROSCOPIC (REFLEX) - Abnormal; Notable for the following components:   Bacteria, UA MANY (*)    All other components within normal limits  COMPREHENSIVE METABOLIC PANEL - Abnormal; Notable for the following components:   Sodium 134 (*)    Potassium 3.1 (*)    Chloride 93 (*)    BUN 5 (*)    Calcium 7.9 (*)    Total Protein 6.2 (*)    Albumin 2.8 (*)    AST 55 (*)    Total Bilirubin 2.1 (*)    All other components within normal limits  URINE CULTURE  LACTIC ACID, PLASMA  PREGNANCY, URINE  TROPONIN I  LACTIC ACID, PLASMA    EKG  ED ECG REPORT   Date: 05/30/2018  Rate: 96  Rhythm: normal sinus rhythm  QRS Axis: normal  Intervals: normal  ST/T Wave abnormalities:  normal  Conduction Disutrbances:none  Narrative Interpretation:   Old EKG Reviewed: none available  I have personally reviewed the EKG tracing and agree with the computerized printout as noted.    Radiology Dg Chest 2 View  Result Date: 05/30/2018 CLINICAL DATA:  28 year old female with 2-3 weeks of bilateral leg swelling and fever of 101 today. EXAM: CHEST - 2 VIEW COMPARISON:  09/29/2016 and earlier. FINDINGS: Normal cardiac size and mediastinal contours. Normal lung volumes and both lungs appear clear. Small pleural effusions in 2018 are resolved. No pneumothorax. Negative visible osseous structures; minor thoracic scoliosis. Negative visible bowel gas pattern. IMPRESSION: Negative.  No cardiopulmonary abnormality. Electronically Signed   By: Odessa Fleming M.D.   On: 05/30/2018 05:16    Procedures Procedures (including critical care time)  Medications Ordered in ED Medications  ketorolac (TORADOL) 30 MG/ML injection 30 mg (30 mg Intravenous Given 05/30/18 0551)  cefTRIAXone (ROCEPHIN) 1 g in sodium chloride 0.9 % 100 mL IVPB ( Intravenous Stopped 05/30/18 1610)     Initial Impression / Assessment and Plan / ED Course  I have reviewed the triage vital signs and the nursing notes.  Pertinent labs & imaging results that were available during my care of the patient were reviewed by me and considered in my medical decision making (see chart for details).        Patient presents with multiple complaints including bilateral knee pain which is acute on chronic, chest discomfort and noted to have a fever.  She has a temperature of 101.9 at triage.  Otherwise her vital signs are largely reassuring.  I reviewed her chart.  Patient is adamant that she has not received any diagnoses or positive test results from her urgent care visit.  It appears she did have a UTI at that time.  Repeat lab work obtained.  She is low risk for COVID.  I suspect her fever may be related to UTI.  She has a slight  leukocytosis but no lactic acidosis and no signs of sepsis.  Regarding her knees, there is no evidence of erythema to suggest infection or septic joint.  Her CMP does show low protein and low potassium, both of these can cause nonspecific tingling and/or swelling.  She has a history of alcohol abuse but denies any significant recent use.  Patient's urine was cultured.  She was given Rocephin for UTI.  Otherwise she will be given potassium supplementation.  I have talked to her about increasing protein in her diet and eating well-balanced food.  She is to follow-up with her primary physician for repeat metabolic panel.  Regarding her chest discomfort, EKG shows no evidence of arrhythmia or ischemia.  Chest x-ray shows no evidence of pneumothorax or pneumonia.  Doubt ACS.  Doubt PE.  After history, exam, and medical workup I feel the patient has been appropriately medically screened and is safe for discharge home. Pertinent diagnoses were discussed with the patient. Patient was given return precautions.   Final Clinical Impressions(s) / ED Diagnoses   Final diagnoses:  Pyelonephritis  Chronic pain of both knees  Hypoalbuminemia  Hypokalemia    ED Discharge Orders         Ordered    cephALEXin (KEFLEX) 500 MG capsule  3 times daily     05/30/18 0637    potassium chloride SA (K-DUR) 20 MEQ tablet  Daily     05/30/18 8016           Shon Baton, MD 05/30/18 [phone]

## 2018-05-30 NOTE — ED Notes (Signed)
Pt c/o "feeling as if bra is on too tight" when she woke up this morning, but was not wearing one. Also states her BIL LE are tingly and tight feeling with subjective swelling. Denies pain,  SHOB, cough, sore throat. Pt febrile on arrival, and reports dysuria.

## 2018-05-30 NOTE — ED Triage Notes (Addendum)
Pt states that she was seen a couple weeks ago for nausea in the ED. States those symptoms improved.  States she has swelling to her knees for the past month and was seen at urgent care for same symptoms on Friday.  Fever today. C/o general weakness. Denies any sob. States she felt "tight" around her chest area. Has not taken anything for pain today. States she has been in quarantine and not around anyone sick.

## 2018-05-30 NOTE — ED Notes (Signed)
Patient transported to X-ray 

## 2018-05-30 NOTE — Discharge Instructions (Signed)
You were seen today for several complaints.  Your fever is likely related to urinary tract infection.  Take antibiotics as prescribed.  Additionally you were noted to have low potassium and low protein in your blood.  Low potassium can sometimes cause numbness and tingling.  Low protein can cause swelling.  Follow-up with your primary physician for recheck after taking supplemental potassium.  Make sure to eat a well-balanced diet.

## 2018-06-01 LAB — URINE CULTURE: Culture: >=100000 — AB

## 2018-06-02 ENCOUNTER — Telehealth: Payer: Self-pay

## 2018-06-02 NOTE — Telephone Encounter (Signed)
Post ED Visit - Positive Culture Follow-up  Culture report reviewed by antimicrobial stewardship pharmacist: [facility] []  Enzo Bi, Pharm.D. []  Celedonio Miyamoto, Pharm.D., BCPS AQ-ID []  Garvin Fila, Pharm.D., BCPS []  Georgina Pillion, Pharm.D., BCPS []  [place], [address].D., BCPS, AAHIVP []  Estella Husk, Pharm.D., BCPS, AAHIVP []  Lysle Pearl, PharmD, BCPS []  Phillips Climes, PharmD, BCPS []  Agapito Games, PharmD, BCPS []  Verlan Friends, PharmD []  Mervyn Gay, PharmD, BCPS []  Vinnie Level, PharmD [facility] []  Len Childs, PharmD []  Greer Pickerel, PharmD []  Adalberto Cole, PharmD []  Perlie Gold, Rph []  Lonell Face) Jean Rosenthal, PharmD []  Earl Many, PharmD []  Junita Push, PharmD []  Dorna Leitz, PharmD []  Terrilee Files, PharmD []  Lynann Beaver, PharmD []  Keturah Barre, PharmD []  Loralee Pacas, PharmD []  Bernadene Person, PharmD   Positive urine culture Treated with Cephalexin, organism sensitive to the same and no further patient follow-up is required at this time.  Jerry Caras 06/02/2018, 8:29 AM

## 2018-06-09 ENCOUNTER — Other Ambulatory Visit: Payer: Self-pay

## 2018-06-09 ENCOUNTER — Emergency Department (HOSPITAL_BASED_OUTPATIENT_CLINIC_OR_DEPARTMENT_OTHER)
Admission: EM | Admit: 2018-06-09 | Discharge: 2018-06-09 | Disposition: A | Discharge status: Home / Self Care | Payer: BC Managed Care – PPO | Attending: Emergency Medicine | Admitting: Emergency Medicine

## 2018-06-09 ENCOUNTER — Encounter (HOSPITAL_BASED_OUTPATIENT_CLINIC_OR_DEPARTMENT_OTHER): Payer: Self-pay

## 2018-06-09 DIAGNOSIS — I509 Heart failure, unspecified: Secondary | ICD-10-CM | POA: Diagnosis not present

## 2018-06-09 DIAGNOSIS — R202 Paresthesia of skin: Secondary | ICD-10-CM | POA: Diagnosis not present

## 2018-06-09 DIAGNOSIS — I110 Hypertensive heart disease with heart failure: Secondary | ICD-10-CM | POA: Insufficient documentation

## 2018-06-09 DIAGNOSIS — R29818 Other symptoms and signs involving the nervous system: Secondary | ICD-10-CM | POA: Diagnosis not present

## 2018-06-09 DIAGNOSIS — Z79899 Other long term (current) drug therapy: Secondary | ICD-10-CM | POA: Insufficient documentation

## 2018-06-09 LAB — SEDIMENTATION RATE: Sed Rate: 32 mm/hr — ABNORMAL HIGH (ref 0–22)

## 2018-06-09 MED ORDER — GABAPENTIN 100 MG PO CAPS: 100.0000 mg | ORAL_CAPSULE | Freq: Every day | ORAL | 0 refills | Status: DC

## 2018-06-09 MED ORDER — VITAMIN B-1 100 MG PO TABS
100.0000 mg | ORAL_TABLET | Freq: Once | ORAL | Status: AC
  Administered 2018-06-09: 100 mg via ORAL
  Filled 2018-06-09: qty 1

## 2018-06-09 MED ORDER — HYDROCODONE-ACETAMINOPHEN 5-325 MG PO TABS
1.0000 | ORAL_TABLET | Freq: Once | ORAL | Status: AC
  Administered 2018-06-09: 20:00:00 1 via ORAL
  Filled 2018-06-09: qty 1

## 2018-06-09 MED ORDER — THIAMINE HCL 100 MG/ML IJ SOLN: 100.0000 mg | Freq: Once | INTRAMUSCULAR | Status: DC

## 2018-06-09 MED ORDER — GABAPENTIN 100 MG PO CAPS
100.0000 mg | ORAL_CAPSULE | Freq: Once | ORAL | Status: AC
  Administered 2018-06-09: 20:00:00 100 mg via ORAL
  Filled 2018-06-09: qty 1

## 2018-06-09 NOTE — ED Triage Notes (Signed)
Pt c/o joint pain to knees, calves, feet, hand-sx started 1 month ago-has been seen x 4 for same c/o-pt NAD-to triage in w/c

## 2018-06-09 NOTE — ED Notes (Signed)
ED Provider at bedside. 

## 2018-06-09 NOTE — Discharge Instructions (Addendum)
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 Please do not drink alcohol. See you primary care provider TOMORROW to try and set up vitamin B injections. If you do not address you low B1 levels this may cause a brain disorder.If you are worse or not better you should return to the ER for possible admission. Please follow up with neurology as well.

## 2018-06-09 NOTE — ED Provider Notes (Signed)
[facility] EMERGENCY DEPARTMENT Provider Note   CSN: [id number] Arrival date & time: 06/09/18  1802    History   Chief Complaint Chief Complaint  Patient presents with  . Joint Pain    HPI Maureen Hanna is a 28 y.o. female.     Patient is 28 year old female with past medical history of alcohol abuse, hyponatremia, hypokalemia, CHF presents to the emergency department for paresthesias.  Patient reports that for the last month that she has been getting constant paresthesias in her hands or feet.  Reports that this started in her bilateral feet and progressed to her left hand and her right hand.  Reports that she feels burning, stinging and pain.  Reports that she feels like this is affecting her gait now and has had 1 or 2 falls over the last month.  Did not hit her head or pass out.  Reports that she did not have any preceding viral illness.  Denies any rashes, tick bites.  Denies any fever, chills.  Reports she has been seen at 4 different urgent care and primary care places where some lab work was done but no definitive answer was given.  Reports that the pain is affecting her sleep.  Reports that she had quit drinking alcohol for about 10 days but then drank again yesterday.  The pain runs from her mid thighs all the way down to her feet and her bilateral hands in a stocking and glove distribution.     Past Medical History:  Diagnosis Date  . Alcohol abuse   . Alcoholic hepatitis   . CHF (congestive heart failure) (HCC)   . Dehydration   . GERD (gastroesophageal reflux disease)   . Sleep apnea    "mask hasn't been ordered" (09/29/2016)    Patient Active Problem List   Diagnosis Date Noted  . Acute congestive heart failure (HCC) 09/29/2016  . Chest pain 09/29/2016  . Orthopnea 09/29/2016  . Intractable nausea and vomiting 09/18/2016  . Acidosis 09/18/2016  . Colitis 09/18/2016  . Elevated liver enzymes 09/18/2016  . Hyponatremia 09/18/2016  . Hypokalemia  09/18/2016  . Alcohol abuse 09/18/2016    Past Surgical History:  Procedure Laterality Date  . NO PAST SURGERIES       OB History   No obstetric history on file.      Home Medications    Prior to Admission medications   Medication Sig Start Date End Date Taking? Authorizing Provider  cephALEXin (KEFLEX) 500 MG capsule Take 1 capsule (500 mg total) by mouth 3 (three) times daily. 05/30/18   Horton, Mayer Masker, MD  feeding supplement, ENSURE ENLIVE, (ENSURE ENLIVE) LIQD Take 237 mLs by mouth 3 (three) times daily between meals. Patient taking differently: Take 237 mLs by mouth daily.  09/19/16   Kathlen Mody, MD  folic acid (FOLVITE) 1 MG tablet Take 1 tablet (1 mg total) by mouth daily. 09/20/16   Kathlen Mody, MD  gabapentin (NEURONTIN) 100 MG capsule Take 1 capsule (100 mg total) by mouth at bedtime for 7 days. 06/09/18 06/16/18  Arlyn Dunning, PA-C  ibuprofen (ADVIL,MOTRIN) 600 MG tablet Take 1 tablet (600 mg total) by mouth every 6 (six) hours as needed. 02/21/18   Ward, Chase Picket, PA-C  pantoprazole (PROTONIX) 40 MG tablet Take 1 tablet (40 mg total) by mouth daily. 09/19/16 09/30/16  Kathlen Mody, MD  potassium chloride SA (K-DUR) 20 MEQ tablet Take 2 tablets (40 mEq total) by mouth daily. 05/30/18   Horton, Toni Amend  F, MD  promethazine (PHENERGAN) 25 MG tablet Take 1 tablet (25 mg total) by mouth every 6 (six) hours as needed for nausea. 02/21/18   Ward, Chase PicketJaime Pilcher, PA-C  thiamine 100 MG tablet Take 1 tablet (100 mg total) by mouth daily. 09/20/16   Kathlen ModyAkula, Vijaya, MD    Family History Family History  Problem Relation Age of Onset  . Liver disease Neg Hx     Social History Social History   Tobacco Use  . Smoking status: Never Smoker  . Smokeless tobacco: Never Used  Substance Use Topics  . Alcohol use: Yes    Comment: occ  . Drug use: No     Allergies   Patient has no known allergies.   Review of Systems Review of Systems  Constitutional: Negative.   Eyes:  Negative for visual disturbance.  Respiratory: Negative for cough and shortness of breath.   Cardiovascular: Negative for chest pain.  Gastrointestinal: Negative for abdominal pain, nausea and vomiting.  Endocrine: Negative for polyuria.  Genitourinary: Negative for dysuria.  Musculoskeletal: Positive for arthralgias and gait problem.  Skin: Negative for color change, pallor, rash and wound.  Allergic/Immunologic: Negative for immunocompromised state.  Neurological: Positive for numbness. Negative for dizziness, tremors, weakness, light-headedness and headaches.  Hematological: Does not bruise/bleed easily.  Psychiatric/Behavioral: Negative for confusion.     Physical Exam Updated Vital Signs BP 130/89   Pulse (!) 138   Temp 98.7 F (37.1 C) (Oral)   Resp 20   Ht 5\' 7"  (1.702 m)   Wt 59 kg   LMP 05/24/2018   SpO2 100%   BMI 20.36 kg/m   Physical Exam Vitals signs and nursing note reviewed.  Constitutional:      General: She is not in acute distress.    Appearance: Normal appearance. She is not ill-appearing, toxic-appearing or diaphoretic.  HENT:     Head: Normocephalic.  Eyes:     Extraocular Movements: Extraocular movements intact.     Conjunctiva/sclera: Conjunctivae normal.  Pulmonary:     Effort: Pulmonary effort is normal.  Abdominal:     General: Abdomen is flat.  Skin:    General: Skin is dry.     Capillary Refill: Capillary refill takes less than 2 seconds.  Neurological:     Mental Status: She is alert.     Comments: Normal sensation appreciated in the upper and lower extremities. She does have a positive romberg test. No skin changes. Abnormal proprioception.   Psychiatric:        Mood and Affect: Mood normal.      ED Treatments / Results  Labs (all labs ordered are listed, but only abnormal results are displayed) Labs Reviewed  SEDIMENTATION RATE - Abnormal; Notable for the following components:      Result Value   Sed Rate 32 (*)    All other  components within normal limits  TSH  VITAMIN B1  METHYLMALONIC ACID(MMA), RND URINE    EKG None  Radiology No results found.  Procedures Procedures (including critical care time)  Medications Ordered in ED Medications  gabapentin (NEURONTIN) capsule 100 mg (100 mg Oral Given 06/09/18 2008)  HYDROcodone-acetaminophen (NORCO/VICODIN) 5-325 MG per tablet 1 tablet (1 tablet Oral Given 06/09/18 2008)  thiamine (VITAMIN B-1) tablet 100 mg (100 mg Oral Given 06/09/18 2108)     Initial Impression / Assessment and Plan / ED Course  I have reviewed the triage vital signs and the nursing notes.  Pertinent labs & imaging results that  were available during my care of the patient were reviewed by me and considered in my medical decision making (see chart for details).  Clinical Course as of Jun 10 719  Thu Jun 09, 2018  2029 Case discussed with Dr. Jerrell Belfast, neurology.  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  She does not have any recent viral illnesses to suggest Guyon Barr syndrome.  She does have a mildly positive Romberg.  I discussed with the patient admission to the hospital for this and she declined.  Discussed with her the progression to Warneke's encephalitis and permanent complications.  She still declined admission.  Patient reports that she would like to go home and she promises to follow-up with her primary care doctor tomorrow.  I will CC the primary care doctor on this note so that she knows the plan for thiamine injections.  We do not have IV thiamine at this hospital.  We will give her oral 100 mg thiamine. I offered admission for the third time and explained to the patient the importance of getting her thiamine levels up and that oral medication would not be sufficient. She reports she understands but would prefer to go home and call her pmd and see her tomorrow morning. I advised if she is unable to start treatment right  away outpatient then she should return to the ER for admission. Patient has been tachycardic. This is not new for her and at baseline she is tachycardic. She also reports this is not new and insists she go home.    [KM]    Clinical Course User Index [KM] Arlyn Dunning, PA-C         Final Clinical Impressions(s) / ED Diagnoses   Final diagnoses:  Paresthesia  Positive Romberg test    ED Discharge Orders         Ordered    gabapentin (NEURONTIN) 100 MG capsule  Daily at bedtime,   Status:  Discontinued     06/09/18 2100    gabapentin (NEURONTIN) 100 MG capsule  Daily at bedtime     06/09/18 2101           Jeral Pinch 06/10/18 0045    Arby Barrette, MD 06/15/18 [phone]

## 2018-06-10 LAB — TSH: TSH: 2.548 u[IU]/mL (ref 0.350–4.500)

## 2018-06-12 ENCOUNTER — Emergency Department (HOSPITAL_COMMUNITY): Payer: BC Managed Care – PPO

## 2018-06-12 ENCOUNTER — Encounter (HOSPITAL_COMMUNITY): Payer: Self-pay

## 2018-06-12 ENCOUNTER — Inpatient Hospital Stay (HOSPITAL_COMMUNITY)
Admission: EM | Admit: 2018-06-12 | Discharge: 2018-06-14 | DRG: 074 | Disposition: A | Discharge status: Home / Self Care | Payer: BC Managed Care – PPO | Attending: Internal Medicine | Admitting: Internal Medicine

## 2018-06-12 ENCOUNTER — Other Ambulatory Visit: Payer: Self-pay

## 2018-06-12 DIAGNOSIS — G621 Alcoholic polyneuropathy: Secondary | ICD-10-CM | POA: Diagnosis present

## 2018-06-12 DIAGNOSIS — R202 Paresthesia of skin: Secondary | ICD-10-CM | POA: Diagnosis present

## 2018-06-12 DIAGNOSIS — R270 Ataxia, unspecified: Secondary | ICD-10-CM

## 2018-06-12 DIAGNOSIS — G629 Polyneuropathy, unspecified: Secondary | ICD-10-CM | POA: Diagnosis not present

## 2018-06-12 DIAGNOSIS — Z79899 Other long term (current) drug therapy: Secondary | ICD-10-CM | POA: Diagnosis not present

## 2018-06-12 DIAGNOSIS — R030 Elevated blood-pressure reading, without diagnosis of hypertension: Secondary | ICD-10-CM | POA: Diagnosis not present

## 2018-06-12 DIAGNOSIS — G4730 Sleep apnea, unspecified: Secondary | ICD-10-CM | POA: Diagnosis present

## 2018-06-12 DIAGNOSIS — M79642 Pain in left hand: Secondary | ICD-10-CM | POA: Diagnosis present

## 2018-06-12 DIAGNOSIS — K219 Gastro-esophageal reflux disease without esophagitis: Secondary | ICD-10-CM | POA: Diagnosis present

## 2018-06-12 DIAGNOSIS — Z1159 Encounter for screening for other viral diseases: Secondary | ICD-10-CM

## 2018-06-12 DIAGNOSIS — F1010 Alcohol abuse, uncomplicated: Secondary | ICD-10-CM | POA: Diagnosis present

## 2018-06-12 DIAGNOSIS — Z791 Long term (current) use of non-steroidal anti-inflammatories (NSAID): Secondary | ICD-10-CM

## 2018-06-12 LAB — COMPREHENSIVE METABOLIC PANEL
ALT: 56 U/L — ABNORMAL HIGH (ref 0–44)
AST: 103 U/L — ABNORMAL HIGH (ref 15–41)
Albumin: 3.6 g/dL (ref 3.5–5.0)
Alkaline Phosphatase: 79 U/L (ref 38–126)
Anion gap: 13 (ref 5–15)
BUN: <5 mg/dL — ABNORMAL LOW (ref 6–20)
CO2: 24 mmol/L (ref 22–32)
Calcium: 8.9 mg/dL (ref 8.9–10.3)
Chloride: 103 mmol/L (ref 98–111)
Creatinine, Ser: 0.61 mg/dL (ref 0.44–1.00)
GFR calc Af Amer: >60 mL/min (ref >60)
GFR calc non Af Amer: >60 mL/min (ref >60)
Glucose, Bld: 96 mg/dL (ref 70–99)
Potassium: 3.9 mmol/L (ref 3.5–5.1)
Sodium: 140 mmol/L (ref 135–145)
Total Bilirubin: 0.5 mg/dL (ref 0.3–1.2)
Total Protein: 7.8 g/dL (ref 6.5–8.1)

## 2018-06-12 LAB — CBC WITH DIFFERENTIAL/PLATELET
Abs Immature Granulocytes: 0 10*3/uL (ref 0.00–0.07)
Basophils Absolute: 0.1 10*3/uL (ref 0.0–0.1)
Basophils Relative: 3 %
Eosinophils Absolute: 0 10*3/uL (ref 0.0–0.5)
Eosinophils Relative: 1 %
HCT: 34 % — ABNORMAL LOW (ref 36.0–46.0)
Hemoglobin: 11.4 g/dL — ABNORMAL LOW (ref 12.0–15.0)
Immature Granulocytes: 0 %
Lymphocytes Relative: 40 %
Lymphs Abs: 1.6 10*3/uL (ref 0.7–4.0)
MCH: 33.3 pg (ref 26.0–34.0)
MCHC: 33.5 g/dL (ref 30.0–36.0)
MCV: 99.4 fL (ref 80.0–100.0)
Monocytes Absolute: 0.5 10*3/uL (ref 0.1–1.0)
Monocytes Relative: 13 %
Neutro Abs: 1.8 10*3/uL (ref 1.7–7.7)
Neutrophils Relative %: 43 %
Platelets: 408 10*3/uL — ABNORMAL HIGH (ref 150–400)
RBC: 3.42 MIL/uL — ABNORMAL LOW (ref 3.87–5.11)
RDW: 14.5 % (ref 11.5–15.5)
WBC: 4 10*3/uL (ref 4.0–10.5)
nRBC: 0 % (ref 0.0–0.2)

## 2018-06-12 LAB — MRSA PCR SCREENING: MRSA by PCR: NEGATIVE

## 2018-06-12 LAB — ETHANOL: Alcohol, Ethyl (B): 221 mg/dL — ABNORMAL HIGH (ref <10)

## 2018-06-12 LAB — TSH: TSH: 1.311 u[IU]/mL (ref 0.350–4.500)

## 2018-06-12 LAB — SARS CORONAVIRUS 2 BY RT PCR (HOSPITAL ORDER, PERFORMED IN ~~LOC~~ HOSPITAL LAB): SARS Coronavirus 2: NEGATIVE

## 2018-06-12 LAB — VITAMIN B12: Vitamin B-12: 567 pg/mL (ref 180–914)

## 2018-06-12 MED ORDER — OXYCODONE-ACETAMINOPHEN 5-325 MG PO TABS
1.0000 | ORAL_TABLET | ORAL | Status: DC | PRN
  Administered 2018-06-12: 23:00:00 2 via ORAL
  Administered 2018-06-12: 1 via ORAL
  Administered 2018-06-13 – 2018-06-14 (×8): 2 via ORAL
  Filled 2018-06-12 (×10): qty 2

## 2018-06-12 MED ORDER — THIAMINE HCL 100 MG/ML IJ SOLN
Freq: Once | INTRAVENOUS | Status: AC
  Administered 2018-06-12: 06:00:00 via INTRAVENOUS
  Filled 2018-06-12: qty 1000

## 2018-06-12 MED ORDER — OXYCODONE-ACETAMINOPHEN 5-325 MG PO TABS
1.0000 | ORAL_TABLET | Freq: Four times a day (QID) | ORAL | Status: DC | PRN
  Administered 2018-06-12: 2 via ORAL
  Filled 2018-06-12: qty 2

## 2018-06-12 MED ORDER — THIAMINE HCL 100 MG/ML IJ SOLN: 100.0000 mg | Freq: Every day | INTRAMUSCULAR | Status: DC

## 2018-06-12 MED ORDER — GABAPENTIN 300 MG PO CAPS
300.0000 mg | ORAL_CAPSULE | Freq: Once | ORAL | Status: AC
  Administered 2018-06-12: 300 mg via ORAL
  Filled 2018-06-12: qty 1

## 2018-06-12 MED ORDER — GABAPENTIN 100 MG PO CAPS
100.0000 mg | ORAL_CAPSULE | Freq: Three times a day (TID) | ORAL | Status: DC
  Administered 2018-06-12 – 2018-06-14 (×6): 100 mg via ORAL
  Filled 2018-06-12 (×6): qty 1

## 2018-06-12 MED ORDER — TRAZODONE HCL 50 MG PO TABS
50.0000 mg | ORAL_TABLET | Freq: Once | ORAL | Status: AC
  Administered 2018-06-13: 01:00:00 50 mg via ORAL
  Filled 2018-06-12: qty 1

## 2018-06-12 MED ORDER — KETOROLAC TROMETHAMINE 15 MG/ML IJ SOLN
15.0000 mg | Freq: Once | INTRAMUSCULAR | Status: AC
  Administered 2018-06-12: 04:00:00 15 mg via INTRAVENOUS
  Filled 2018-06-12: qty 1

## 2018-06-12 MED ORDER — ONDANSETRON HCL 4 MG/2ML IJ SOLN: 4.0000 mg | Freq: Four times a day (QID) | INTRAMUSCULAR | Status: DC | PRN

## 2018-06-12 MED ORDER — ONDANSETRON HCL 4 MG PO TABS: 4.0000 mg | ORAL_TABLET | Freq: Four times a day (QID) | ORAL | Status: DC | PRN

## 2018-06-12 MED ORDER — FOLIC ACID 1 MG PO TABS
1.0000 mg | ORAL_TABLET | Freq: Every day | ORAL | Status: DC
  Administered 2018-06-12 – 2018-06-14 (×3): 1 mg via ORAL
  Filled 2018-06-12 (×3): qty 1

## 2018-06-12 MED ORDER — THIAMINE HCL 100 MG/ML IJ SOLN: 100.0000 mg | Freq: Three times a day (TID) | INTRAMUSCULAR | Status: DC

## 2018-06-12 MED ORDER — SODIUM CHLORIDE 0.9 % IV BOLUS
1000.0000 mL | Freq: Once | INTRAVENOUS | Status: AC
  Administered 2018-06-12: 1000 mL via INTRAVENOUS

## 2018-06-12 MED ORDER — DIPHENHYDRAMINE HCL 25 MG PO CAPS
25.0000 mg | ORAL_CAPSULE | Freq: Every evening | ORAL | Status: DC | PRN
  Administered 2018-06-12 – 2018-06-14 (×2): 25 mg via ORAL
  Filled 2018-06-12 (×2): qty 1

## 2018-06-12 MED ORDER — ADULT MULTIVITAMIN W/MINERALS CH
1.0000 | ORAL_TABLET | Freq: Every day | ORAL | Status: DC
  Administered 2018-06-12 – 2018-06-14 (×3): 1 via ORAL
  Filled 2018-06-12 (×3): qty 1

## 2018-06-12 MED ORDER — THIAMINE HCL 100 MG/ML IJ SOLN
500.0000 mg | Freq: Three times a day (TID) | INTRAVENOUS | Status: AC
  Administered 2018-06-12 – 2018-06-14 (×9): 500 mg via INTRAVENOUS
  Filled 2018-06-12 (×11): qty 5

## 2018-06-12 MED ORDER — HYDRALAZINE HCL 20 MG/ML IJ SOLN: 10.0000 mg | Freq: Four times a day (QID) | INTRAMUSCULAR | Status: DC | PRN

## 2018-06-12 MED ORDER — LORAZEPAM 2 MG/ML IJ SOLN
1.0000 mg | Freq: Once | INTRAMUSCULAR | Status: AC
  Administered 2018-06-12: 1 mg via INTRAVENOUS
  Filled 2018-06-12: qty 1

## 2018-06-12 MED ORDER — THIAMINE HCL 100 MG/ML IJ SOLN
100.0000 mg | Freq: Once | INTRAMUSCULAR | Status: AC
  Administered 2018-06-12: 100 mg via INTRAVENOUS
  Filled 2018-06-12: qty 2

## 2018-06-12 NOTE — Progress Notes (Signed)
Patient requesting pain medications. Dr. Truitt Merle.

## 2018-06-12 NOTE — ED Provider Notes (Signed)
[facility]-EMERGENCY DEPT Provider Note   CSN: [id number] Arrival date & time: 06/12/18  0204    History   Chief Complaint Chief Complaint  Patient presents with  . Foot Pain  . Hand Pain    HPI Maureen Hanna is a 28 y.o. female.     HPI  This is a 28 year old female with a history of alcoholic abuse, heart failure, sleep apnea, recent history of arthralgias, myalgias, and paresthesias who presents with bilateral hand and foot pain.  Patient reports she has seen multiple doctors and has had multiple interventions since mid April for progressive bilateral leg pain, foot pain, bilateral hand pain.  She has been noted to have low potassium for which she is taking supplementation.  She was also found to have a UTI for which she has taken antibiotics.  She reports that she tried to follow-up with her primary physician for a B12 shot but was unable to get this.  She is taking oral B12 at home.  Patient reports that her symptoms are getting progressively worse.  She denies any weakness for reports burning in her hands and her feet.  She reports that it is making it difficult for her to walk and sleep at night.  She denies any fevers, nausea, vomiting, abdominal pain.  Patient reports that she had stopped drinking but because of the pain she had 2 glasses of wine yesterday.  She has not been able to follow-up with neurology as recommended.  Past Medical History:  Diagnosis Date  . Alcohol abuse   . Alcoholic hepatitis   . CHF (congestive heart failure) (HCC)   . Dehydration   . GERD (gastroesophageal reflux disease)   . Sleep apnea    "mask hasn't been ordered" (09/29/2016)    Patient Active Problem List   Diagnosis Date Noted  . Paresthesias 06/12/2018  . Ataxia 06/12/2018  . Acute congestive heart failure (HCC) 09/29/2016  . Chest pain 09/29/2016  . Orthopnea 09/29/2016  . Intractable nausea and vomiting 09/18/2016  . Acidosis 09/18/2016  . Colitis  09/18/2016  . Elevated liver enzymes 09/18/2016  . Hyponatremia 09/18/2016  . Hypokalemia 09/18/2016  . Alcohol abuse 09/18/2016    Past Surgical History:  Procedure Laterality Date  . NO PAST SURGERIES       OB History   No obstetric history on file.      Home Medications    Prior to Admission medications   Medication Sig Start Date End Date Taking? Authorizing Provider  cephALEXin (KEFLEX) 500 MG capsule Take 1 capsule (500 mg total) by mouth 3 (three) times daily. 05/30/18  Yes Janaki Exley, Mayer Masker, MD  Ferrous Sulfate (IRON PO) Take 1 tablet by mouth daily.   Yes [provider]  gabapentin (NEURONTIN) 100 MG capsule Take 1 capsule (100 mg total) by mouth at bedtime for 7 days. 06/09/18 06/16/18 Yes Ronnie Doss A, PA-C  potassium chloride SA (K-DUR) 20 MEQ tablet Take 2 tablets (40 mEq total) by mouth daily. 05/30/18  Yes Tyshan Enderle, Mayer Masker, MD  Probiotic Product (PROBIOTIC-10 PO) Take 1 tablet by mouth daily.   Yes [provider]  feeding supplement, ENSURE ENLIVE, (ENSURE ENLIVE) LIQD Take 237 mLs by mouth 3 (three) times daily between meals. Patient not taking: Reported on 06/12/2018 09/19/16   Kathlen Mody, MD  folic acid (FOLVITE) 1 MG tablet Take 1 tablet (1 mg total) by mouth daily. Patient not taking: Reported on 06/12/2018 09/20/16   Kathlen Mody, MD  ibuprofen (  ADVIL,MOTRIN) 600 MG tablet Take 1 tablet (600 mg total) by mouth every 6 (six) hours as needed. Patient not taking: Reported on 06/12/2018 02/21/18   Ward, Chase PicketJaime Pilcher, PA-C  promethazine (PHENERGAN) 25 MG tablet Take 1 tablet (25 mg total) by mouth every 6 (six) hours as needed for nausea. Patient not taking: Reported on 06/12/2018 02/21/18   Ward, Chase PicketJaime Pilcher, PA-C  thiamine 100 MG tablet Take 1 tablet (100 mg total) by mouth daily. Patient not taking: Reported on 06/12/2018 09/20/16   Kathlen ModyAkula, Vijaya, MD    Family History Family History  Problem Relation Age of Onset  . Liver disease Neg Hx      Social History Social History   Tobacco Use  . Smoking status: Never Smoker  . Smokeless tobacco: Never Used  Substance Use Topics  . Alcohol use: Yes    Comment: occ  . Drug use: No     Allergies   Patient has no known allergies.   Review of Systems Review of Systems  Constitutional: Negative for fever.  Respiratory: Negative for shortness of breath.   Cardiovascular: Negative for chest pain.  Gastrointestinal: Negative for abdominal pain.  Musculoskeletal: Positive for arthralgias and myalgias.  Skin: Negative for rash and wound.  Neurological: Negative for speech difficulty, weakness and numbness.  Psychiatric/Behavioral: The patient is nervous/anxious.      Physical Exam Updated Vital Signs BP 124/84 (BP Location: Left Arm)   Pulse (!) 109   Temp 98.5 F (36.9 C) (Oral)   Resp 20   Wt 59 kg   LMP 05/24/2018   SpO2 98%   BMI 20.36 kg/m   Physical Exam Vitals signs and nursing note reviewed.  Constitutional:      Appearance: She is well-developed.     Comments: Anxious appearing but nontoxic  HENT:     Head: Normocephalic and atraumatic.     Mouth/Throat:     Comments: Mucous membranes dry Eyes:     Pupils: Pupils are equal, round, and reactive to light.  Neck:     Musculoskeletal: Neck supple.  Cardiovascular:     Rate and Rhythm: Regular rhythm. Tachycardia present.     Heart sounds: Normal heart sounds.  Pulmonary:     Effort: Pulmonary effort is normal. No respiratory distress.     Breath sounds: No wheezing.  Abdominal:     General: Bowel sounds are normal.     Palpations: Abdomen is soft.     Tenderness: There is no abdominal tenderness.     Hernia: No hernia is present.  Skin:    General: Skin is warm and dry.  Neurological:     Mental Status: She is alert and oriented to person, place, and time.     Comments: Fluent speech, cranial nerves II through XII intact, strength testing difficult secondary to patient cooperation secondary to  pain, strength appears symmetric, no drift, patient has a wide-based gait, unable to tandem gait  Psychiatric:     Comments: Anxious      ED Treatments / Results  Labs (all labs ordered are listed, but only abnormal results are displayed) Labs Reviewed  CBC WITH DIFFERENTIAL/PLATELET - Abnormal; Notable for the following components:      Result Value   RBC 3.42 (*)    Hemoglobin 11.4 (*)    HCT 34.0 (*)    Platelets 408 (*)    All other components within normal limits  COMPREHENSIVE METABOLIC PANEL - Abnormal; Notable for the following components:  BUN <5 (*)    AST 103 (*)    ALT 56 (*)    All other components within normal limits  ETHANOL - Abnormal; Notable for the following components:   Alcohol, Ethyl (B) 221 (*)    All other components within normal limits  SARS CORONAVIRUS 2 (HOSPITAL ORDER, PERFORMED IN  HOSPITAL LAB)  VITAMIN B1  TSH  VITAMIN B12    EKG None  Radiology Ct Head Wo Contrast  Result Date: 06/12/2018 CLINICAL DATA:  28 y/o  F; ataxia. EXAM: CT HEAD WITHOUT CONTRAST TECHNIQUE: Contiguous axial images were obtained from the base of the skull through the vertex without intravenous contrast. COMPARISON:  None. FINDINGS: Brain: No evidence of acute infarction, hemorrhage, hydrocephalus, extra-axial collection or mass lesion/mass effect. Vascular: No hyperdense vessel or unexpected calcification. Skull: Normal. Negative for fracture or focal lesion. Sinuses/Orbits: No acute finding. Other: None. IMPRESSION: Negative CT of the head. Electronically Signed   By: Mitzi Hansen M.D.   On: 06/12/2018 05:11    Procedures Procedures (including critical care time)  Medications Ordered in ED Medications  ondansetron (ZOFRAN) tablet 4 mg (has no administration in time range)    Or  ondansetron (ZOFRAN) injection 4 mg (has no administration in time range)  folic acid (FOLVITE) tablet 1 mg (has no administration in time range)  multivitamin  with minerals tablet 1 tablet (has no administration in time range)  thiamine (B-1) injection 100 mg (has no administration in time range)  LORazepam (ATIVAN) injection 1 mg (has no administration in time range)  thiamine (B-1) injection 100 mg (100 mg Intravenous Given 06/12/18 0353)  ketorolac (TORADOL) 15 MG/ML injection 15 mg (15 mg Intravenous Given 06/12/18 0353)  sodium chloride 0.9 % bolus 1,000 mL (0 mLs Intravenous Stopped 06/12/18 0535)  gabapentin (NEURONTIN) capsule 300 mg (300 mg Oral Given 06/12/18 0623)  sodium chloride 0.9 % 1,000 mL with thiamine 100 mg, folic acid 1 mg, multivitamins adult 10 mL infusion ( Intravenous New Bag/Given 06/12/18 0622)     Initial Impression / Assessment and Plan / ED Course  I have reviewed the triage vital signs and the nursing notes.  Pertinent labs & imaging results that were available during my care of the patient were reviewed by me and considered in my medical decision making (see chart for details).        Patient presents with worsening hand and leg tingling and paresthesias.  She is seen multiple providers and has had progressively worsening symptoms.  She is tachycardic on exam.  She has evidence of ataxia.  I suspect she is not also being forthcoming with her alcohol use.  CBC, BMP, ethanol were obtained.  B1 testing pending.  Patient was given IV thiamine and fluids.  She has no significant metabolic derangements today.  LFTs remain slightly elevated.  Blood alcohol level is 221.  I had a long discussion with the patient regarding her ongoing alcohol use.  I suspect that she may have polyneuropathy or potentially Wernicke's but it is difficult to assess given her acute alcohol level.  Will admit for further work-up.  Neurology was consulted.  They will evaluate.  Have added B12 level.  Hospitalist to admit.  Final Clinical Impressions(s) / ED Diagnoses   Final diagnoses:  Polyneuropathy  Ataxia  Alcohol abuse    ED Discharge Orders     None       Shon Baton, MD 06/12/18 [phone]

## 2018-06-12 NOTE — Consult Note (Signed)
Requesting Physician: Dr. Dina Rich     Chief Complaint: Burning pain in hands and feet  History obtained from: Patient and Chart    HPI:                                                                                                                                       Maureen Hanna is a 28 y.o. female with past medical history of significant alcohol abuse comes to the long emergency room with worsening numbness and tingling in both her hands and legs as well as difficulty walking..  Patient states that about 2 months ago she noticed swelling in both her knees, followed by cramps and tightness in both her legs.  About 3 weeks ago, she started to have numbness and tingling in both her feet that have gradually worsened.  She has noticed difficulty walking and is having more falls.  About 3 to 4 days ago she started to notice burning and tingling in her fingers, that has now spread to her to involve the entirety of both hands which she describes as very painful.   Presented to [facility] 3 days ago.  She was noted to have a UTI.  Case was discussed with me ( not Dr. Rory Percy) when I was on call and based on the history, was concern for thiamine deficiency and recommended IV thiamine and B12, given her alcohol abuse. Per chart review, it appears they did not have IV thiamine at the hospital and they recommended admission however patient declined.  I have also recommended certain labs, however it appears that B12 level was not ordered, however patient is now on oral vitamin B12  She followed up with her PCPs office for B12 injection, however they did not have that and patient was started on oral B12.  Continue to get worse and therefore presented to [facility].  Patient denies any other symptoms like shortness of breath, any recent viral or GI illness.     Past Medical History:  Diagnosis Date  . Alcohol abuse   . Alcoholic hepatitis   . CHF (congestive heart failure) ([facility])    . Dehydration   . GERD (gastroesophageal reflux disease)   . Sleep apnea    "mask hasn't been ordered" (09/29/2016)    Past Surgical History:  Procedure Laterality Date  . NO PAST SURGERIES      Family History  Problem Relation Age of Onset  . Liver disease Neg Hx    Social History:  reports that she has never smoked. She has never used smokeless tobacco. She reports current alcohol use. She reports that she does not use drugs.  Allergies: No Known Allergies  Medications:  I reviewed home medications  ROS:                                                                                                                                     14 systems reviewed and negative except above    Examination:                                                                                                      General: Appears well-developed and well-nourished.  Psych: Affect appropriate to situation Eyes: No scleral injection HENT: No OP obstrucion Head: Normocephalic.  Cardiovascular: Normal rate and regular rhythm.  Respiratory: Effort normal and breath sounds normal to anterior ascultation GI: Soft.  No distension. There is no tenderness.  Skin: WDI    Neurological Examination Mental Status: Alert, oriented, thought content appropriate.  Speech fluent without evidence of aphasia. Able to follow 3 step commands without difficulty. Cranial Nerves: II: Visual fields grossly normal,  III,IV, VI: ptosis not present, extra-ocular motions intact bilaterally, pupils equal, round, reactive to light and accommodation V,VII: smile symmetric, facial light touch sensation normal bilaterally VIII: hearing normal bilaterally IX,X: uvula rises symmetrically XI: bilateral shoulder shrug XII: midline tongue extension Motor: Right : Upper extremity    Left:     Upper  extremity 5/5 deltoid       5/5 deltoid 5/5 tricep      5/5 tricep 5/5 biceps      5/5 biceps  5/5wrist flexion     5/5 wrist flexion 5/5 wrist extension     5/5 wrist extension 5/5 hand grip      5/5 hand grip  Lower extremity     Lower extremity 5/5 hip flexor      5/5 hip flexor 5/5 hip adductors     5/5 hip adductors 5/5 hip abductors     5/5 hip abductors 5/5 quadricep      5/5 quadriceps  5/5 hamstrings     5/5 hamstrings 4/5 plantar flexion       4/5 plantar flexion 5/5 plantar extension     5/5 plantar extension Tone and bulk:normal tone throughout; no atrophy noted Sensory: Reduced to pinprick and light touch over both feet, slightly over hands.  Reduced position sense at the great toe.  Vibration slightly impaired at the great toe. Deep Tendon Reflexes:  Right: Upper Extremity   Left: Upper extremity   biceps (C-5 to C-6) 2/4   biceps (C-5 to C-6) 2/4 tricep (C7) 2/4  triceps (C7) 2/4 Brachioradialis (C6) 2/4  Brachioradialis (C6) 2/4  Lower Extremity Lower Extremity  quadriceps (L-2 to L-4) 2/4   quadriceps (L-2 to L-4) 2/4 Achilles (S1) 2/4   Achilles (S1) 2/4 Plantars: Right: downgoing   Left: downgoing Cerebellar: normal finger-to-nose, normal rapid alternating movements and normal heel-to-shin test normal gait and station Tone and bulk:normal tone throughout; no atrophy noted Sensory:  Deep Tendon Reflexes: 2+ and symmetric throughout Plantars: Right: downgoing   Left: downgoing Cerebellar: normal finger-to-nose, normal rapid alternating movements and normal heel-to-shin test Gait: Romberg sign positive, has truncal ataxia and drags both feet     Lab Results: Basic Metabolic Panel: Recent Labs  Lab 06/12/18 0324  NA 140  K 3.9  CL 103  CO2 24  GLUCOSE 96  BUN <5*  CREATININE 0.61  CALCIUM 8.9    CBC: Recent Labs  Lab 06/12/18 0324  WBC 4.0  NEUTROABS 1.8  HGB 11.4*  HCT 34.0*  MCV 99.4  PLT 408*    Coagulation Studies: No results  for input(s): LABPROT, INR in the last 72 hours.  Imaging: Ct Head Wo Contrast  Result Date: 06/12/2018 CLINICAL DATA:  28 y/o  F; ataxia. EXAM: CT HEAD WITHOUT CONTRAST TECHNIQUE: Contiguous axial images were obtained from the base of the skull through the vertex without intravenous contrast. COMPARISON:  None. FINDINGS: Brain: No evidence of acute infarction, hemorrhage, hydrocephalus, extra-axial collection or mass lesion/mass effect. Vascular: No hyperdense vessel or unexpected calcification. Skull: Normal. Negative for fracture or focal lesion. Sinuses/Orbits: No acute finding. Other: None. IMPRESSION: Negative CT of the head. Electronically Signed   By: Kristine Garbe M.D.   On: 06/12/2018 05:11     I have reviewed the above imaging : CT head is unremarkable    ASSESSMENT AND PLAN   28 year old female with past medical history of significant alcohol abuse presents to the ED with 3-week history of bilateral numbness tingling and burning sensation of both feet and hands with difficulty walking.  On examination patient does have impaired position sense and slightly reduced sensation to light touch and temperature in both feet and hands.  Also has weakness to dorsiflexion in both legs and absent ankles reflexes.    Labs ordered at [facility] included B1, methylmalonic acid and ESR and TSH.  TSH was normal, Esr mildly high.  B12 was not ordered, today however patient is already on oral B12. Even subacute presentation absence of viral illness, low suspicion for Gullian Barr.(AIDP). However if B1, B12 levels returned normal: Consider LP and IVIG.  Subacute peripheral neuropathy EtOH abuse Suspected vitamin deficiency   Recommendations F/u B1 and B12 levels, added RPR, Copper  Vitamin B12 injection 1066mgx 5 days High dose Thiamine 500 mg IV q8h x 2 days followed 1026mdaily  Continue to follow PT OT evaluation  Sushanth Aroor [facility] Pager Number [phone]

## 2018-06-12 NOTE — H&P (Signed)
History and Physical    Maureen Hanna NGE:[id number] DOB: [birth year] DOA: 06/12/2018  PCP: Burnis Medin, PA-C  Patient coming from:  home  Chief Complaint:   Trouble walking  HPI: Maureen Hanna is a 28 y.o. female with medical history significant of etoh usage daily comes in with 2 weeks of numbness and tingling to hands and feet and imbalance in walking.  She feels like the walking issue is her knees.  No swelling in her knees .  No fevers.  No n/v/d.  She initially denied drinking anymore but when asked about her blood etoh level today she then admitted to drinking.  Reports diet is normal.  No swelling.  No rashes anywhere.  Neurology called recommending transfer to [facility] for further eval.   Review of Systems: As per HPI otherwise 10 point review of systems negative.   Past Medical History:  Diagnosis Date  . Alcohol abuse   . Alcoholic hepatitis   . CHF (congestive heart failure) ([facility])   . Dehydration   . GERD (gastroesophageal reflux disease)   . Sleep apnea    "mask hasn't been ordered" (09/29/2016)    Past Surgical History:  Procedure Laterality Date  . NO PAST SURGERIES       reports that she has never smoked. She has never used smokeless tobacco. She reports current alcohol use. She reports that she does not use drugs.  No Known Allergies  Family History  Problem Relation Age of Onset  . Liver disease Neg Hx     Prior to Admission medications   Medication Sig Start Date End Date Taking? Authorizing Provider  cephALEXin (KEFLEX) 500 MG capsule Take 1 capsule (500 mg total) by mouth 3 (three) times daily. 05/30/18  Yes Horton, Mayer Masker, MD  Ferrous Sulfate (IRON PO) Take 1 tablet by mouth daily.   Yes [provider]  gabapentin (NEURONTIN) 100 MG capsule Take 1 capsule (100 mg total) by mouth at bedtime for 7 days. 06/09/18 06/16/18 Yes Ronnie Doss A, PA-C  potassium chloride SA (K-DUR) 20 MEQ tablet Take 2 tablets (40 mEq total) by mouth  daily. 05/30/18  Yes Horton, Mayer Masker, MD  Probiotic Product (PROBIOTIC-10 PO) Take 1 tablet by mouth daily.   Yes [provider]  feeding supplement, ENSURE ENLIVE, (ENSURE ENLIVE) LIQD Take 237 mLs by mouth 3 (three) times daily between meals. Patient not taking: Reported on 06/12/2018 09/19/16   Kathlen Mody, MD  folic acid (FOLVITE) 1 MG tablet Take 1 tablet (1 mg total) by mouth daily. Patient not taking: Reported on 06/12/2018 09/20/16   Kathlen Mody, MD  ibuprofen (ADVIL,MOTRIN) 600 MG tablet Take 1 tablet (600 mg total) by mouth every 6 (six) hours as needed. Patient not taking: Reported on 06/12/2018 02/21/18   Ward, Chase Picket, PA-C  promethazine (PHENERGAN) 25 MG tablet Take 1 tablet (25 mg total) by mouth every 6 (six) hours as needed for nausea. Patient not taking: Reported on 06/12/2018 02/21/18   Ward, Chase Picket, PA-C  thiamine 100 MG tablet Take 1 tablet (100 mg total) by mouth daily. Patient not taking: Reported on 06/12/2018 09/20/16   Kathlen Mody, MD    Physical Exam: Vitals:   06/12/18 0216 06/12/18 0400 06/12/18 0500  BP: (!) 141/105 118/85 124/84  Pulse: (!) 121 (!) 104 (!) 109  Resp: 16 18 20   Temp: 98.5 F (36.9 C)    TempSrc: Oral    SpO2: 99% 98% 98%  Weight: 59 kg  Constitutional: NAD, calm, comfortable Vitals:   06/12/18 0216 06/12/18 0400 06/12/18 0500  BP: (!) 141/105 118/85 124/84  Pulse: (!) 121 (!) 104 (!) 109  Resp: 16 18 20   Temp: 98.5 F (36.9 C)    TempSrc: Oral    SpO2: 99% 98% 98%  Weight: 59 kg     Eyes: PERRL, lids and conjunctivae normal ENMT: Mucous membranes are moist. Posterior pharynx clear of any exudate or lesions.Normal dentition.  Neck: normal, supple, no masses, no thyromegaly Respiratory: clear to auscultation bilaterally, no wheezing, no crackles. Normal respiratory effort. No accessory muscle use.  Cardiovascular: Regular rate and rhythm, no murmurs / rubs / gallops. No extremity edema. 2+ pedal pulses. No  carotid bruits.  Abdomen: no tenderness, no masses palpated. No hepatosplenomegaly. Bowel sounds positive.  Musculoskeletal: no clubbing / cyanosis. No joint deformity upper and lower extremities. Good ROM, no contractures. Normal muscle tone.  Skin: no rashes, lesions, ulcers. No induration Neurologic: CN 2-12 grossly intact. Sensation intact, DTR normal. Strength 5/5 in all 4.  Psychiatric: Normal judgment and insight. Alert and oriented x 3. Normal mood.    Labs on Admission: I have personally reviewed following labs and imaging studies  CBC: Recent Labs  Lab 06/12/18 0324  WBC 4.0  NEUTROABS 1.8  HGB 11.4*  HCT 34.0*  MCV 99.4  PLT 408*   Basic Metabolic Panel: Recent Labs  Lab 06/12/18 0324  NA 140  K 3.9  CL 103  CO2 24  GLUCOSE 96  BUN <5*  CREATININE 0.61  CALCIUM 8.9   GFR: Estimated Creatinine Clearance: 96.6 mL/min (by C-G formula based on SCr of 0.61 mg/dL). Liver Function Tests: Recent Labs  Lab 06/12/18 0324  AST 103*  ALT 56*  ALKPHOS 79  BILITOT 0.5  PROT 7.8  ALBUMIN 3.6   No results for input(s): LIPASE, AMYLASE in the last 168 hours. No results for input(s): AMMONIA in the last 168 hours. Coagulation Profile: No results for input(s): INR, PROTIME in the last 168 hours. Cardiac Enzymes: No results for input(s): CKTOTAL, CKMB, CKMBINDEX, TROPONINI in the last 168 hours. BNP (last 3 results) No results for input(s): PROBNP in the last 8760 hours. HbA1C: No results for input(s): HGBA1C in the last 72 hours. CBG: No results for input(s): GLUCAP in the last 168 hours. Lipid Profile: No results for input(s): CHOL, HDL, LDLCALC, TRIG, CHOLHDL, LDLDIRECT in the last 72 hours. Thyroid Function Tests: Recent Labs    06/09/18 2103  TSH 2.548   Anemia Panel: No results for input(s): VITAMINB12, FOLATE, FERRITIN, TIBC, IRON, RETICCTPCT in the last 72 hours. Urine analysis:    Component Value Date/Time   COLORURINE AMBER (A) 05/30/2018 0451    APPEARANCEUR CLOUDY (A) 05/30/2018 0451   LABSPEC 1.010 05/30/2018 0451   PHURINE 6.5 05/30/2018 0451   GLUCOSEU 100 (A) 05/30/2018 0451   HGBUR TRACE (A) 05/30/2018 0451   BILIRUBINUR MODERATE (A) 05/30/2018 0451   KETONESUR 15 (A) 05/30/2018 0451   PROTEINUR 100 (A) 05/30/2018 0451   UROBILINOGEN 1.0 09/11/2013 1036   NITRITE POSITIVE (A) 05/30/2018 0451   LEUKOCYTESUR LARGE (A) 05/30/2018 0451   Sepsis Labs: !!!!!!!!!!!!!!!!!!!!!!!!!!!!!!!!!!!!!!!!!!!! @LABRCNTIP (procalcitonin:4,lacticidven:4) )No results found for this or any previous visit (from the past 240 hour(s)).   Radiological Exams on Admission: Ct Head Wo Contrast  Result Date: 06/12/2018 CLINICAL DATA:  28 y/o  F; ataxia. EXAM: CT HEAD WITHOUT CONTRAST TECHNIQUE: Contiguous axial images were obtained from the base of the skull through the vertex without  intravenous contrast. COMPARISON:  None. FINDINGS: Brain: No evidence of acute infarction, hemorrhage, hydrocephalus, extra-axial collection or mass lesion/mass effect. Vascular: No hyperdense vessel or unexpected calcification. Skull: Normal. Negative for fracture or focal lesion. Sinuses/Orbits: No acute finding. Other: None. IMPRESSION: Negative CT of the head. Electronically Signed   By: Mitzi Hansen M.D.   On: 06/12/2018 05:11   Old chart reviewed Case discussed with edp  Assessment/Plan 28 yo female etoh abuse comes in with parathesias/ataxia  Principal Problem:   Paresthesias- ck thiamine , folic , tsh.  Obtain mri.  Give banana bag, given  thiamine already in ed.  No focal def.  Obtain neuro eval and transfer to [place].  Active Problems:   Alcohol abuse- noted, no evidence of withdrawal at this time   Ataxia- likely related to complications from etoh usage.  W/u as above      DVT prophylaxis: scds Code Status: full Family Communication: none  Disposition Plan:  days Consults called:  neuro Admission status:  admit   DAVID,RACHAL  A MD [facility]  If 7PM-7AM, please contact night-coverage [email or web address] Password [facility]  06/12/2018, 5:52 AM

## 2018-06-12 NOTE — ED Notes (Signed)
[facility] arrived  

## 2018-06-12 NOTE — Progress Notes (Signed)
The patient was admitted early this morning after midnight and H&P has been reviewed and I am in current agreement with assessment and plan done by Dr. Tarry Kos.  Additional changes to the plan of care been made accordingly.  The patient is a 28 year old African-American female with a past medical history significant for alcohol use daily who comes in with 2 weeks no numbness and tingling to the hand and feet and balance and walking.  She feels like the walking issues in her knees but she had no swelling in her knees and has not had any fevers.  She initially denied drinking on as about her blood alcohol level she admitted to drinking.  Neurology was consulted and they are recommending transfer to [facility] for further evaluation.  I spoke with the on-call neurologist as well as the nighttime neurologist and they both stated to cancel the MRI and further work-up can be initiated at home.  Was admitted for paresthesias with her ataxia and history of alcohol abuse.  She was given an banana bag and thiamine.  MRI was canceled recommendation of the neurology team.  Neurology feels that this is low suspicion for Guyon Barr and they recommend ordering a B1 and B12 level and if these normal they are considering LP and IVIG.  RPR and copper levels were also added and patient will be getting B12 injection 2000 mcg x 5 days and high-dose v min 500 mg IV every 8 x2 days followed by 100 mg p.o. daily.  PT and OT is to evaluate.  We will continue monitor patient clinical response to intervention and repeat blood work in the AM.

## 2018-06-12 NOTE — ED Notes (Signed)
Report given to [facility]. 

## 2018-06-12 NOTE — ED Triage Notes (Addendum)
Per EMS, patient coming from home with complaints of burning pain in her hands and feet for the past two weeks. Patient has been seen by PCP and at [facility], and states that she was only helped when given IV fluids and hydrocodone at [facility]. Patient was also given potassium to take at home and is on day 9 with no relief. Pain became progressively worse tonight which is why she called EMS.   Patient also endorses stiff knee joints for the past two months, stating that "it feels like I have duct tape on my knees." Because of the stiff knees, patient did have a fall at her apartment two days ago. No obvious injuries from fall. Patient endorses that it has been difficult for her to walk because of the pain and stiffness.

## 2018-06-12 NOTE — Progress Notes (Signed)
Maureen Hanna [id number] Admission Data: 06/12/2018 12:04 PM Attending Provider: Joycelyn Das, MD  QQI:[id number], Deloria Lair, PA-C Consults/ Treatment Team:   Maureen Hanna is a 28 y.o. female patient admitted from ED awake, alert  & orientated  X 3,  Full Code, VSS - Blood pressure (!) 133/102, pulse (!) 105, temperature 98.5 F (36.9 C), temperature source Oral, resp. rate 16, weight 59 kg, last menstrual period 05/24/2018, SpO2 99 %., O2    RA no c/o shortness of breath, no c/o chest pain, no distress noted. Tele # [id number] placed and pt is currently running:normal sinus rhythm.   IV site WDL:  antecubital left, condition patent and no redness with a transparent dsg that's clean dry and intact.  Allergies:  No Known Allergies   Past Medical History:  Diagnosis Date  . Alcohol abuse   . Alcoholic hepatitis   . CHF (congestive heart failure) ([facility])   . Dehydration   . GERD (gastroesophageal reflux disease)   . Sleep apnea    "mask hasn't been ordered" (09/29/2016)    History:  obtained from chart review. Alcohol:  history of alcohol abuse yes  Pt orientation to unit, room and routine. Information packet given to patient/family and safety video watched.  Admission INP armband ID verified with patient/family, and in place. SR up x 2, fall risk assessment complete with Patient and family verbalizing understanding of risks associated with falls. Pt verbalizes an understanding of how to use the call bell and to call for help before getting out of bed.  Skin, clean-dry- intact without evidence of bruising, or skin tears.   No evidence of skin break down noted on exam. no rashes, no ecchymoses, no petechiae, no nodules, no jaundice, no purpura, no wounds    Will cont to monitor and assist as needed.  Theodore Demark, RN 06/12/2018 12:04 PM

## 2018-06-12 NOTE — ED Notes (Signed)
[facility] Called 

## 2018-06-12 NOTE — ED Notes (Signed)
Attempted to call for report. 

## 2018-06-12 NOTE — ED Notes (Signed)
ED TO INPATIENT HANDOFF REPORT  Name/Age/Gender Maureen Hanna 28 y.o. female  Code Status    Code Status Orders  (From admission, onward)         Start     Ordered   06/12/18 0547  Full code  Continuous     06/12/18 0549        Code Status History    Date Active Date Inactive Code Status Order ID Comments User Context   09/29/2016 2025 10/01/2016 1533 Full Code [id number]  Delano Metz, MD Inpatient   09/18/2016 0458 09/19/2016 2049 Full Code [id number]  Clydie Braun, MD Inpatient      Home/SNF/Other Home  Chief Complaint Nerve pain hand and feet  Level of Care/Admitting Diagnosis ED Disposition    ED Disposition Condition Comment   Admit  Hospital Area: [facility] [[id number]]  Level of Care: Progressive [102]  Covid Evaluation: Screening Protocol (No Symptoms)  Diagnosis: Ataxia [782956]  Admitting Physician: Haydee Monica [[id number]]  Attending Physician: Tarry Kos A [[id number]]  Estimated length of stay: past midnight tomorrow  Certification:: I certify this patient will need inpatient services for at least 2 midnights  PT Class (Do Not Modify): Inpatient [101]  PT Acc Code (Do Not Modify): Private [1]       Medical History Past Medical History:  Diagnosis Date  . Alcohol abuse   . Alcoholic hepatitis   . CHF (congestive heart failure) (HCC)   . Dehydration   . GERD (gastroesophageal reflux disease)   . Sleep apnea    "mask hasn't been ordered" (09/29/2016)    Allergies No Known Allergies  IV Location/Drains/Wounds Patient Lines/Drains/Airways Status   Active Line/Drains/Airways    Name:   Placement date:   Placement time:   Site:   Days:   Peripheral IV 06/12/18 Left Antecubital   06/12/18    0349    Antecubital   less than 1          Labs/Imaging Results for orders placed or performed during the hospital encounter of 06/12/18 (from the past 48 hour(s))  CBC with Differential     Status: Abnormal   Collection Time:  06/12/18  3:24 AM  Result Value Ref Range   WBC 4.0 4.0 - 10.5 K/uL   RBC 3.42 (L) 3.87 - 5.11 MIL/uL   Hemoglobin 11.4 (L) 12.0 - 15.0 g/dL   HCT 21.3 (L) 08.6 - 57.8 %   MCV 99.4 80.0 - 100.0 fL   MCH 33.3 26.0 - 34.0 pg   MCHC 33.5 30.0 - 36.0 g/dL   RDW 46.9 62.9 - 52.8 %   Platelets 408 (H) 150 - 400 K/uL   nRBC 0.0 0.0 - 0.2 %   Neutrophils Relative % 43 %   Neutro Abs 1.8 1.7 - 7.7 K/uL   Lymphocytes Relative 40 %   Lymphs Abs 1.6 0.7 - 4.0 K/uL   Monocytes Relative 13 %   Monocytes Absolute 0.5 0.1 - 1.0 K/uL   Eosinophils Relative 1 %   Eosinophils Absolute 0.0 0.0 - 0.5 K/uL   Basophils Relative 3 %   Basophils Absolute 0.1 0.0 - 0.1 K/uL   Immature Granulocytes 0 %   Abs Immature Granulocytes 0.00 0.00 - 0.07 K/uL    Comment: Performed at [facility], [address] [place], [place] [address]  Comprehensive metabolic panel     Status: Abnormal   Collection Time: 06/12/18  3:24 AM  Result Value Ref Range  Sodium 140 135 - 145 mmol/L   Potassium 3.9 3.5 - 5.1 mmol/L   Chloride 103 98 - 111 mmol/L   CO2 24 22 - 32 mmol/L   Glucose, Bld 96 70 - 99 mg/dL   BUN <5 (L) 6 - 20 mg/dL   Creatinine, Ser 1.610.61 0.44 - 1.00 mg/dL   Calcium 8.9 8.9 - 09.610.3 mg/dL   Total Protein 7.8 6.5 - 8.1 g/dL   Albumin 3.6 3.5 - 5.0 g/dL   AST 045103 (H) 15 - 41 U/L   ALT 56 (H) 0 - 44 U/L   Alkaline Phosphatase 79 38 - 126 U/L   Total Bilirubin 0.5 0.3 - 1.2 mg/dL   GFR calc non Af Amer >60 >60 mL/min   GFR calc Af Amer >60 >60 mL/min   Anion gap 13 5 - 15    Comment: Performed at [facility], [address] [place], [place] [address]  Ethanol     Status: Abnormal   Collection Time: 06/12/18  3:26 AM  Result Value Ref Range   Alcohol, Ethyl (B) 221 (H) <10 mg/dL    Comment: (NOTE) Lowest detectable limit for serum alcohol is 10 mg/dL. For medical purposes only. Performed at [facility], [address] [place],  [place] [id number]   SARS Coronavirus 2 (CEPHEID - Performed in [facility] hospital lab), Hosp Order     Status: None   Collection Time: 06/12/18  5:20 AM  Result Value Ref Range   SARS Coronavirus 2 NEGATIVE NEGATIVE    Comment: (NOTE) If result is NEGATIVE SARS-CoV-2 target nucleic acids are NOT DETECTED. The SARS-CoV-2 RNA is generally detectable in upper and lower  respiratory specimens during the acute phase of infection. The lowest  concentration of SARS-CoV-2 viral copies this assay can detect is 250  copies / mL. A negative result does not preclude SARS-CoV-2 infection  and should not be used as the sole basis for treatment or other  patient management decisions.  A negative result may occur with  improper specimen collection / handling, submission of specimen other  than nasopharyngeal swab, presence of viral mutation(s) within the  areas targeted by this assay, and inadequate number of viral copies  (<250 copies / mL). A negative result must be combined with clinical  observations, patient history, and epidemiological information. If result is POSITIVE SARS-CoV-2 target nucleic acids are DETECTED. The SARS-CoV-2 RNA is generally detectable in upper and lower  respiratory specimens dur ing the acute phase of infection.  Positive  results are indicative of active infection with SARS-CoV-2.  Clinical  correlation with patient history and other diagnostic information is  necessary to determine patient infection status.  Positive results do  not rule out bacterial infection or co-infection with other viruses. If result is PRESUMPTIVE POSTIVE SARS-CoV-2 nucleic acids MAY BE PRESENT.   A presumptive positive result was obtained on the submitted specimen  and confirmed on repeat testing.  While 2019 novel coronavirus  (SARS-CoV-2) nucleic acids may be present in the submitted sample  additional confirmatory testing may be necessary for epidemiological  and / or clinical management  purposes  to differentiate between  SARS-CoV-2 and other Sarbecovirus currently known to infect humans.  If clinically indicated additional testing with an alternate test  methodology [phone] is advised. The SARS-CoV-2 RNA is generally  detectable in upper and lower respiratory sp ecimens during the acute  phase of infection. The expected result is Negative. Fact Sheet for Patients:  BoilerBrush.com.cy Fact Sheet for Healthcare Providers: https://pope.com/ This test is not yet approved or cleared by the [place] FDA and has been authorized for detection and/or diagnosis of SARS-CoV-2 by FDA under an Emergency Use Authorization (EUA).  This EUA will remain in effect (meaning this test can be used) for the duration of the COVID-19 declaration under Section 564(b)(1) of the Act, 21 U.S.C. section 360bbb-3(b)(1), unless the authorization is terminated or revoked sooner. Performed at [facility], [address], [place], [place] [address]   TSH     Status: None   Collection Time: 06/12/18  5:50 AM  Result Value Ref Range   TSH 1.311 0.350 - 4.500 uIU/mL    Comment: Performed by a 3rd Generation assay with a functional sensitivity of <=0.01 uIU/mL. Performed at [facility], [address] [place], [place] [address]   Vitamin B12     Status: None   Collection Time: 06/12/18  5:59 AM  Result Value Ref Range   Vitamin B-12 567 180 - 914 pg/mL    Comment: (NOTE) This assay is not validated for testing neonatal or myeloproliferative syndrome specimens for Vitamin B12 levels. Performed at [facility], [address], [place], [place] [address]    Ct Head Wo Contrast  Result Date: 06/12/2018 CLINICAL DATA:  28 y/o  F; ataxia. EXAM: CT HEAD WITHOUT CONTRAST TECHNIQUE: Contiguous axial images were obtained from the base of the skull through the vertex without intravenous  contrast. COMPARISON:  None. FINDINGS: Brain: No evidence of acute infarction, hemorrhage, hydrocephalus, extra-axial collection or mass lesion/mass effect. Vascular: No hyperdense vessel or unexpected calcification. Skull: Normal. Negative for fracture or focal lesion. Sinuses/Orbits: No acute finding. Other: None. IMPRESSION: Negative CT of the head. Electronically Signed   By: Mitzi Hansen M.D.   On: 06/12/2018 05:11    Pending Labs Unresulted Labs (From admission, onward)    Start     Ordered   06/13/18 0500  Basic metabolic panel  Tomorrow morning,   R     06/12/18 0549   06/13/18 0500  CBC  Tomorrow morning,   R     06/12/18 0549   06/12/18 0415  Vitamin B1  Once,   R     06/12/18 0414          Vitals/Pain Today's Vitals   06/12/18 0216 06/12/18 0400 06/12/18 0500 06/12/18 0700  BP: (!) 141/105 118/85 124/84 (!) 132/99  Pulse: (!) 121 (!) 104 (!) 109 (!) 110  Resp: 16 18 20 20   Temp: 98.5 F (36.9 C)     TempSrc: Oral     SpO2: 99% 98% 98% 98%  Weight: 59 kg     PainSc: 10-Worst pain ever   10-Worst pain ever    Isolation Precautions No active isolations  Medications Medications  ondansetron (ZOFRAN) tablet 4 mg (has no administration in time range)    Or  ondansetron (ZOFRAN) injection 4 mg (has no administration in time range)  folic acid (FOLVITE) tablet 1 mg (has no administration in time range)  multivitamin with minerals tablet 1 tablet (has no administration in time range)  thiamine 500mg  in normal saline (27ml) IVPB (has no administration in time range)  thiamine (B-1) injection 100 mg (100 mg Intravenous Given 06/12/18 0353)  ketorolac (TORADOL) 15 MG/ML injection 15 mg (15 mg Intravenous Given 06/12/18 0353)  sodium chloride 0.9 % bolus 1,000 mL (0 mLs Intravenous Stopped 06/12/18 0535)  gabapentin (NEURONTIN) capsule 300  mg (300 mg Oral Given 06/12/18 0623)  sodium chloride 0.9 % 1,000 mL with thiamine 100 mg, folic acid 1 mg, multivitamins adult  10 mL infusion ( Intravenous New Bag/Given 06/12/18 0622)  LORazepam (ATIVAN) injection 1 mg (1 mg Intravenous Given 06/12/18 0651)    Mobility walks with person assist

## 2018-06-12 NOTE — ED Notes (Signed)
Bed: WA08 Expected date:  Expected time:  Means of arrival:  Comments: 15F nerve pain

## 2018-06-12 NOTE — ED Notes (Signed)
Patient ambulated to restroom with minimal assistance. ?

## 2018-06-13 ENCOUNTER — Inpatient Hospital Stay (HOSPITAL_COMMUNITY): Payer: BC Managed Care – PPO

## 2018-06-13 DIAGNOSIS — F1010 Alcohol abuse, uncomplicated: Secondary | ICD-10-CM

## 2018-06-13 DIAGNOSIS — G629 Polyneuropathy, unspecified: Secondary | ICD-10-CM

## 2018-06-13 LAB — COMPREHENSIVE METABOLIC PANEL
ALT: 49 U/L — ABNORMAL HIGH (ref 0–44)
AST: 111 U/L — ABNORMAL HIGH (ref 15–41)
Albumin: 2.8 g/dL — ABNORMAL LOW (ref 3.5–5.0)
Alkaline Phosphatase: 72 U/L (ref 38–126)
Anion gap: 8 (ref 5–15)
BUN: <5 mg/dL — ABNORMAL LOW (ref 6–20)
CO2: 27 mmol/L (ref 22–32)
Calcium: 8.8 mg/dL — ABNORMAL LOW (ref 8.9–10.3)
Chloride: 101 mmol/L (ref 98–111)
Creatinine, Ser: 0.51 mg/dL (ref 0.44–1.00)
GFR calc Af Amer: >60 mL/min (ref >60)
GFR calc non Af Amer: >60 mL/min (ref >60)
Glucose, Bld: 76 mg/dL (ref 70–99)
Potassium: 3.8 mmol/L (ref 3.5–5.1)
Sodium: 136 mmol/L (ref 135–145)
Total Bilirubin: 1.3 mg/dL — ABNORMAL HIGH (ref 0.3–1.2)
Total Protein: 6 g/dL — ABNORMAL LOW (ref 6.5–8.1)

## 2018-06-13 LAB — CBC WITH DIFFERENTIAL/PLATELET
Abs Immature Granulocytes: 0.01 10*3/uL (ref 0.00–0.07)
Basophils Absolute: 0.1 10*3/uL (ref 0.0–0.1)
Basophils Relative: 1 %
Eosinophils Absolute: 0.1 10*3/uL (ref 0.0–0.5)
Eosinophils Relative: 2 %
HCT: 29.8 % — ABNORMAL LOW (ref 36.0–46.0)
Hemoglobin: 10.2 g/dL — ABNORMAL LOW (ref 12.0–15.0)
Immature Granulocytes: 0 %
Lymphocytes Relative: 27 %
Lymphs Abs: 1.3 10*3/uL (ref 0.7–4.0)
MCH: 33.6 pg (ref 26.0–34.0)
MCHC: 34.2 g/dL (ref 30.0–36.0)
MCV: 98 fL (ref 80.0–100.0)
Monocytes Absolute: 0.7 10*3/uL (ref 0.1–1.0)
Monocytes Relative: 14 %
Neutro Abs: 2.7 10*3/uL (ref 1.7–7.7)
Neutrophils Relative %: 56 %
Platelets: 306 10*3/uL (ref 150–400)
RBC: 3.04 MIL/uL — ABNORMAL LOW (ref 3.87–5.11)
RDW: 13.9 % (ref 11.5–15.5)
WBC: 4.9 10*3/uL (ref 4.0–10.5)
nRBC: 0 % (ref 0.0–0.2)

## 2018-06-13 LAB — PHOSPHORUS: Phosphorus: 4.6 mg/dL (ref 2.5–4.6)

## 2018-06-13 LAB — MAGNESIUM: Magnesium: 1.6 mg/dL — ABNORMAL LOW (ref 1.7–2.4)

## 2018-06-13 MED ORDER — IBUPROFEN 400 MG PO TABS
400.0000 mg | ORAL_TABLET | Freq: Four times a day (QID) | ORAL | Status: DC | PRN
  Administered 2018-06-14 (×2): 400 mg via ORAL
  Filled 2018-06-13 (×2): qty 1

## 2018-06-13 MED ORDER — GADOBUTROL 1 MMOL/ML IV SOLN
6.0000 mL | Freq: Once | INTRAVENOUS | Status: AC | PRN
  Administered 2018-06-13: 20:00:00 6 mL via INTRAVENOUS

## 2018-06-13 MED ORDER — PANTOPRAZOLE SODIUM 40 MG PO TBEC
40.0000 mg | DELAYED_RELEASE_TABLET | Freq: Every day | ORAL | Status: DC
  Administered 2018-06-13 – 2018-06-14 (×2): 40 mg via ORAL
  Filled 2018-06-13 (×2): qty 1

## 2018-06-13 MED ORDER — LORAZEPAM 1 MG PO TABS
1.0000 mg | ORAL_TABLET | Freq: Four times a day (QID) | ORAL | Status: DC | PRN
  Administered 2018-06-13 – 2018-06-14 (×2): 1 mg via ORAL
  Filled 2018-06-13 (×2): qty 1

## 2018-06-13 NOTE — Progress Notes (Addendum)
NEUROLOGY PROGRESS NOTE  Subjective: Patient currently still having burning sensations in her feet which are up to her mid calves, less symptomatic proximally than distally.  She also complains of severe burning in her hands to the extent that she does not allow the examiner to touch them.  She did get 1 dose of Neurontin yesterday and stated that it did lessen the pain about 30%.  Patient states that her last drink was approximately 2 days ago. When she does drink she drinks approximately 5 shots or more because she has a high tolerance.  Neuro:  Mental Status: Alert, oriented, thought content appropriate.   Cranial Nerves: II:  Visual fields grossly normal,  III,IV, VI: Intact V,VII: Symmetrical VIII: hearing normal bilaterally Motor: Right : Upper extremity   5/5    Left:     Upper extremity   5/5  Lower extremity   5/5     Lower extremity   5/5 Sensory: Pinprick equal bilaterally in the upper extremities.  As far as lower extremities she has stocking distribution pain sensation deficit which normalizes approximately halfway up her tibia.  Light touch is bilaterally equal.  Temperature sensation diminished in stocking distribution, normalizes at approximately knee level.  Vibratory sensation is not felt bilaterally at the toes or ankles, but is felt at the knees.  Proprioception unremarkable when the toe is down but cannot sense position when toe is passively dorsiflexed.  Deep Tendon Reflexes: 2+ and symmetric throughout, except for absent achilles reflexes Plantars: Right: downgoing   Left: downgoing Cerebellar: normal finger-to-nose and normal heel-to-shin testing Gait: Wide-based gait   Pertinent Labs/Diagnostics: AST-111 ALT-49  Etta Quill PA-C [facility] [phone]  MRI brain and cervical spine: 1. Normal brain MRI. No evidence for demyelinating disease or other acute abnormality. 2. Normal MRI of the cervical spine. No evidence for demyelinating disease or  other abnormality. No significant disc pathology or stenosis.   Assessment: 28 year old female with past medical history of significant alcohol abuse presents to the ED with a 3-week history of bilateral numbness, tingling and burning sensations of both feet and hands, in conjunction with difficulty walking. 1. On examination patient does have impaired proprioception andslightly reduced sensation to light touch in stocking distribution, as well as decreased temperature sensation up to her knees and in her hands to wrist level. Absent achilles reflexes. 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  2. Labs orderedat [facility]includedB1, methylmalonic acid, ESR and TSH. TSH was normal. ESR was mildly elevated.B12 was normal at 567. B1 and copper levels are pending.  3. Given her subacute presentation and absence of preceding viral illness, there is low suspicion for Iline Oven syndrome (AIDP). 4. Most likely etiology for her sensory polyneuropathy is felt to be chronic EtOH abuse. Thiamine deficiency also on the DDx.   Recommendations: - PT - Pending B1 level - Outpatient EMG with nerve conduction study-we will make outpatient referral -- Starting high-dose empiric thiamine at 500 mg IV TID x 3 days. After regimen is completed, start po thiamine at 100 mg qd.  Electronically signed: Dr. Kerney Elbe 06/13/2018, 9:51 AM

## 2018-06-13 NOTE — Progress Notes (Signed)
Patient asked staff if she can have pain medicine more often or something else between Percocet for pain in legs and hands. MD made aware. Will continue to monitor.

## 2018-06-13 NOTE — Progress Notes (Signed)
PROGRESS NOTE  Arthur Aydelotte WJX:[id number] DOB: 01/11/1991 DOA: 06/12/2018 PCP: Elisabeth Cara, PA-C   LOS: 1 day   Brief narrative: Maureen Hanna is a 28 y.o. female with medical history significant of  alcohol abuse presented to hospital with 2 weeks of numbness and tingling to hands and feet and imbalance in walking.  She feels like the walking issue is her knees.    Patient was then admitted to hospital for further evaluation and treatment.  Subjective: Patient still complains of burning sensation in her feet and hands.  Slightly improved compared to yesterday.  She has a history of alcohol abuse.  Assessment/Plan:  Principal Problem:   Paresthesias Active Problems:   Alcohol abuse   Ataxia  Paresthesia tingling and numbness.  Possibility of peripheral neuropathy.  Alcohol abuse.  Continue on thiamine injection, folic acid.  Neurology has seen the patient.  Recommend MRI of the brain and MRI of the cervical spine to rule out myasthenia gravis.  Vitamin B-12 was 567.  ESR mildly high.  TSH within normal limits.  Continue Neurontin for now.  Follow RPR, copper levels.  History of alcohol abuse.  No signs of withdrawal at this time.  Will closely monitor.     Ataxia-we will check the MRI of the brain.  Likely secondary to alcohol abuse.  Will get physical therapy, Occupational Therapy evaluation.  Elevated blood pressure without history of hypertension.  On PRN hydralazine.  No previous diagnosis of hypertension.  Will closely monitor.  VTE Prophylaxis:  None  Code Status: Full code  Family Communication: None  Disposition Plan: Home likely in 1 to 2 days   Consultants:  Neurology  Procedures:  None  Antibiotics: Anti-infectives (From admission, onward)   None      Objective: Vitals:   06/13/18 0817 06/13/18 1215  BP: (!) 130/93 (!) 136/104  Pulse: 86 85  Resp:  19  Temp: 98.5 F (36.9 C) 98.7 F (37.1 C)  SpO2: 97% 98%     Intake/Output Summary (Last 24 hours) at 06/13/2018 1401 Last data filed at 06/13/2018 1343 Gross per 24 hour  Intake 290 ml  Output -  Net 290 ml   Filed Weights   06/12/18 0216  Weight: 59 kg   Body mass index is 20.36 kg/m.   Physical Exam: GENERAL: Patient is alert awake and oriented. Not in obvious distress.  Thinly built. HENT: No scleral pallor or icterus. Pupils equally reactive to light. Oral mucosa is moist NECK: is supple, no palpable thyroid enlargement. CHEST: Clear to auscultation. No crackles or wheezes. Non tender on palpation. Diminished breath sounds bilaterally. CVS: S1 and S2 heard, no murmur. Regular rate and rhythm. No pericardial rub. ABDOMEN: Soft, non-tender, bowel sounds are present. No palpable hepato-splenomegaly. EXTREMITIES: No edema.Diminished sensation over the lower extremity, decreased proprioception. CNS: Cranial nerves are intact..  Impaired left upper extremity grip strength  sKIN: warm and dry without rashes.  Data Review: I have personally reviewed the following laboratory data and studies,  CBC: Recent Labs  Lab 06/12/18 0324 06/13/18 0315  WBC 4.0 4.9  NEUTROABS 1.8 2.7  HGB 11.4* 10.2*  HCT 34.0* 29.8*  MCV 99.4 98.0  PLT 408* 213   Basic Metabolic Panel: Recent Labs  Lab 06/12/18 0324 06/13/18 0315  NA 140 136  K 3.9 3.8  CL 103 101  CO2 24 27  GLUCOSE 96 76  BUN <5* <5*  CREATININE 0.61 0.51  CALCIUM 8.9 8.8*  MG  --  1.6*  PHOS  --  4.6   Liver Function Tests: Recent Labs  Lab 06/12/18 0324 06/13/18 0315  AST 103* 111*  ALT 56* 49*  ALKPHOS 79 72  BILITOT 0.5 1.3*  PROT 7.8 6.0*  ALBUMIN 3.6 2.8*   No results for input(s): LIPASE, AMYLASE in the last 168 hours. No results for input(s): AMMONIA in the last 168 hours. Cardiac Enzymes: No results for input(s): CKTOTAL, CKMB, CKMBINDEX, TROPONINI in the last 168 hours. BNP (last 3 results) No results for input(s): BNP in the last 8760 hours.  ProBNP  (last 3 results) No results for input(s): PROBNP in the last 8760 hours.  CBG: No results for input(s): GLUCAP in the last 168 hours. Recent Results (from the past 240 hour(s))  SARS Coronavirus 2 (CEPHEID - Performed in [place] hospital lab), Hosp Order     Status: None   Collection Time: 06/12/18  5:20 AM  Result Value Ref Range Status   SARS Coronavirus 2 NEGATIVE NEGATIVE Final    Comment: (NOTE) If result is NEGATIVE SARS-CoV-2 target nucleic acids are NOT DETECTED. The SARS-CoV-2 RNA is generally detectable in upper and lower  respiratory specimens during the acute phase of infection. The lowest  concentration of SARS-CoV-2 viral copies this assay can detect is 250  copies / mL. A negative result does not preclude SARS-CoV-2 infection  and should not be used as the sole basis for treatment or other  patient management decisions.  A negative result may occur with  improper specimen collection / handling, submission of specimen other  than nasopharyngeal swab, presence of viral mutation(s) within the  areas targeted by this assay, and inadequate number of viral copies  (<250 copies / mL). A negative result must be combined with clinical  observations, patient history, and epidemiological information. If result is POSITIVE SARS-CoV-2 target nucleic acids are DETECTED. The SARS-CoV-2 RNA is generally detectable in upper and lower  respiratory specimens dur ing the acute phase of infection.  Positive  results are indicative of active infection with SARS-CoV-2.  Clinical  correlation with patient history and other diagnostic information is  necessary to determine patient infection status.  Positive results do  not rule out bacterial infection or co-infection with other viruses. If result is PRESUMPTIVE POSTIVE SARS-CoV-2 nucleic acids MAY BE PRESENT.   A presumptive positive result was obtained on the submitted specimen  and confirmed on repeat testing.  While 2019 novel  coronavirus  (SARS-CoV-2) nucleic acids may be present in the submitted sample  additional confirmatory testing may be necessary for epidemiological  and / or clinical management purposes  to differentiate between  SARS-CoV-2 and other Sarbecovirus currently known to infect humans.  If clinically indicated additional testing with an alternate test  methodology [phone]) is advised. The SARS-CoV-2 RNA is generally  detectable in upper and lower respiratory sp ecimens during the acute  phase of infection. The expected result is Negative. Fact Sheet for Patients:  StrictlyIdeas.no Fact Sheet for Healthcare Providers: BankingDealers.co.za This test is not yet approved or cleared by the [place] FDA and has been authorized for detection and/or diagnosis of SARS-CoV-2 by FDA under an Emergency Use Authorization (EUA).  This EUA will remain in effect (meaning this test can be used) for the duration of the COVID-19 declaration under Section 564(b)(1) of the Act, 21 U.S.C. section 360bbb-3(b)(1), unless the authorization is terminated or revoked sooner. Performed at [facility], [place] [address], [place], [place] [address]   MRSA PCR Screening  Status: None   Collection Time: 06/12/18 10:55 AM  Result Value Ref Range Status   MRSA by PCR NEGATIVE NEGATIVE Final    Comment:        The GeneXpert MRSA Assay (FDA approved for NASAL specimens only), is one component of a comprehensive MRSA colonization surveillance program. It is not intended to diagnose MRSA infection nor to guide or monitor treatment for MRSA infections. Performed at [facility], [place] [address], [place], [place] [address]      Studies: Ct Head Wo Contrast  Result Date: 06/12/2018 CLINICAL DATA:  28 y/o  F; ataxia. EXAM: CT HEAD WITHOUT CONTRAST TECHNIQUE: Contiguous axial images were obtained from the base of the skull through the vertex  without intravenous contrast. COMPARISON:  None. FINDINGS: Brain: No evidence of acute infarction, hemorrhage, hydrocephalus, extra-axial collection or mass lesion/mass effect. Vascular: No hyperdense vessel or unexpected calcification. Skull: Normal. Negative for fracture or focal lesion. Sinuses/Orbits: No acute finding. Other: None. IMPRESSION: Negative CT of the head. Electronically Signed   By: Kristine Garbe M.D.   On: 06/12/2018 05:11    Scheduled Meds: . folic acid  1 mg Oral Daily  . gabapentin  100 mg Oral TID  . multivitamin with minerals  1 tablet Oral Daily    Continuous Infusions: . thiamine injection 500 mg (06/13/18 1343)     Flora Lipps, MD  [facility] 06/13/2018

## 2018-06-14 MED ORDER — THIAMINE HCL 100 MG PO TABS: 100.0000 mg | ORAL_TABLET | Freq: Every day | ORAL | 3 refills | Status: AC

## 2018-06-14 MED ORDER — PANTOPRAZOLE SODIUM 40 MG PO TBEC: 40.0000 mg | DELAYED_RELEASE_TABLET | Freq: Every day | ORAL | 0 refills | Status: AC

## 2018-06-14 MED ORDER — IBUPROFEN 600 MG PO TABS: 600.0000 mg | ORAL_TABLET | Freq: Four times a day (QID) | ORAL | 0 refills | Status: AC | PRN

## 2018-06-14 MED ORDER — GABAPENTIN 100 MG PO CAPS: 200.0000 mg | ORAL_CAPSULE | Freq: Three times a day (TID) | ORAL | 2 refills | Status: DC

## 2018-06-14 MED ORDER — OXYCODONE-ACETAMINOPHEN 5-325 MG PO TABS: 1.0000 | ORAL_TABLET | Freq: Every day | ORAL | 0 refills | Status: AC | PRN

## 2018-06-14 MED ORDER — FOLIC ACID 1 MG PO TABS: 1.0000 mg | ORAL_TABLET | Freq: Every day | ORAL | 2 refills | Status: AC

## 2018-06-14 NOTE — Discharge Summary (Signed)
Physician Discharge Summary  Maureen Hanna OIZ:[id number] DOB: July 15, 1990 DOA: 06/12/2018  PCP: Elisabeth Cara, PA-C  Admit date: 06/12/2018 Discharge date: 06/14/2018  Admitted From: Home  Discharge disposition: Home   Recommendations for Outpatient Follow-Up:    Follow up with your primary care provider in one week.  Follow-up with neurology as outpatient.    Discharge Diagnosis:   Principal Problem:   Paresthesias Active Problems:   Alcohol abuse   Ataxia  Discharge Condition: Improved.  Diet recommendation: Low sodium, heart healthy.  Wound care: None.  Code status: Full.   History of Present Illness:   Maureen Carringtonis a 28 y.o.femalewith medical history significant of alcohol abuse presented to hospital with 2 weeks of numbness and tingling to hands and feet and imbalance in walking.   Patient was then admitted to hospital for further evaluation and treatment.  Hospital Course:  Patient was admitted to the hospital and following conditions were addressed during hospitalization.  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  Patient received high-dose IV thiamine in the hospital.  This will be followed by oral thiamine every day including folic acid on discharge.  Was seen by neurology.  MRI of the brain and MRI of the cervical spine was negative for multiple sclerosis.    Vitamin B-12 was 567.  ESR mildly high.  TSH within normal limits.     History of alcohol abuse.  No signs of withdrawal during hospitalization.  Was counseled against alcohol usage.  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  MRI of the brain and cervical spine was negative.  Patient was seen by physical therapy who recommended outpatient PT on discharge.  Elevated blood pressure without history of hypertension mild tachycardia on ambulation.  This could be related to her history of alcohol abuse.  Patient will need to  follow-up with primary care physician as outpatient to monitor her blood pressure.  Disposition.  At this time, patient is stable for disposition home.  She will be discharged home after last dose of IV thiamine today.  She will have a follow-up with primary care physician and outpatient neurology for potential need of EMG.   Medical Consultants:    Neurology   Subjective:   Today, patient is of tingling numbness in her extremities but has  improved since admission.  Discharge Exam:   Vitals:   06/14/18 1400 06/14/18 1605  BP: (!) 155/108 (!) 143/67  Pulse: 88 100  Resp: 18 19  Temp: 99.1 F (37.3 C) 98.5 F (36.9 C)  SpO2: 98% 98%   Vitals:   06/14/18 0736 06/14/18 1140 06/14/18 1400 06/14/18 1605  BP: (!) 136/105 (!) 129/92 (!) 155/108 (!) 143/67  Pulse: 89 83 88 100  Resp: _0 Temp: 99 F (37.2 C) 99.2 F (37.3 C) 99.1 F (37.3 C) 98.5 F (36.9 C)  TempSrc: Oral Oral Oral Oral  SpO2: 99% 97% 98% 98%  Weight:       General exam: Appears calm and comfortable ,Not in distress HEENT:PERRL,Oral mucosa moist Respiratory system: Bilateral equal air entry, normal vesicular breath sounds, no wheezes or crackles  Cardiovascular system: S1 & S2 heard, RRR.  Gastrointestinal system: Abdomen is nondistended, soft and nontender. No organomegaly or masses felt. Normal bowel sounds heard. Central nervous system: Alert and oriented.  Decreased sensation and proprioception over the lower extremities.  Decreased left upper extremity grip. Extremities: No edema, no clubbing ,no cyanosis, distal peripheral pulses palpable. Skin: No rashes, lesions  or ulcers,no icterus ,no pallor MSK: Normal muscle bulk,tone ,power    Procedures:    MRI of the brain, MRI cervical spine  The results of significant diagnostics from this hospitalization (including imaging, microbiology, ancillary and laboratory) are listed below for reference.     Diagnostic Studies:   Ct Head Wo  Contrast  Result Date: 06/12/2018 CLINICAL DATA:  28 y/o  F; ataxia. EXAM: CT HEAD WITHOUT CONTRAST TECHNIQUE: Contiguous axial images were obtained from the base of the skull through the vertex without intravenous contrast. COMPARISON:  None. FINDINGS: Brain: No evidence of acute infarction, hemorrhage, hydrocephalus, extra-axial collection or mass lesion/mass effect. Vascular: No hyperdense vessel or unexpected calcification. Skull: Normal. Negative for fracture or focal lesion. Sinuses/Orbits: No acute finding. Other: None. IMPRESSION: Negative CT of the head. Electronically Signed   By: Kristine Garbe M.D.   On: 06/12/2018 05:11   Mr Jeri Cos CZ Contrast  Result Date: 06/13/2018 CLINICAL DATA:  Initial evaluation for 2 week history of numbness and tingling in hands and feet with gait imbalance. Evaluate for possible multiple sclerosis. EXAM: MRI HEAD WITHOUT AND WITH CONTRAST MRI CERVICAL SPINE WITHOUT AND WITH CONTRAST TECHNIQUE: Multiplanar, multiecho pulse sequences of the brain and surrounding structures, and cervical spine, to include the craniocervical junction and cervicothoracic junction, were obtained without and with intravenous contrast. CONTRAST:  6 cc of Gadavist. COMPARISON:  Prior head CT from 06/12/2018. FINDINGS: MRI HEAD FINDINGS Brain: Cerebral volume within normal limits for patient age. No focal parenchymal signal abnormality identified. No abnormal foci of restricted diffusion to suggest acute or subacute ischemia. Gray-white matter differentiation well maintained. No encephalomalacia to suggest chronic infarction. No foci of susceptibility artifact to suggest acute or chronic intracranial hemorrhage. No mass lesion, midline shift or mass effect. No hydrocephalus. No extra-axial fluid collection. Major dural sinuses are grossly patent. Pituitary gland and suprasellar region are normal. Midline structures intact and normal. No abnormal enhancement. Vascular: Major intracranial  vascular flow voids well maintained and normal in appearance. Skull and upper cervical spine: Craniocervical junction normal. Visualized upper cervical spine within normal limits. Bone marrow signal intensity normal. No scalp soft tissue abnormality. Sinuses/Orbits: Globes and orbital soft tissues within normal limits. Paranasal sinuses are clear. No mastoid effusion. Inner ear structures normal. Other: None. MRI CERVICAL SPINE FINDINGS Alignment: Straightening of the normal cervical lordosis without listhesis or subluxation. Vertebrae: Vertebral body heights maintained without evidence for acute or chronic fracture. Bone marrow signal intensity within normal limits. No discrete or worrisome osseous lesions. No abnormal marrow edema or enhancement. Cord: Signal intensity within the cervical spinal cord is normal. No cord signal abnormality to suggest demyelinating disease or other abnormality. No abnormal enhancement. Normal cord caliber and morphology. Posterior Fossa, vertebral arteries, paraspinal tissues: Craniocervical junction within normal limits. Paraspinous and prevertebral soft tissues are normal. Normal intravascular flow voids seen within the vertebral arteries bilaterally. Disc levels: Minor annular disc bulging noted at C5-6 without stenosis. No other significant disc pathology identified. No significant facet degeneration. No canal or neural foraminal stenosis or evidence for neural impingement. IMPRESSION: 1. Normal brain MRI. No evidence for demyelinating disease or other acute abnormality. 2. Normal MRI of the cervical spine. No evidence for demyelinating disease or other abnormality. No significant disc pathology or stenosis. Electronically Signed   By: Jeannine Boga M.D.   On: 06/13/2018 20:15   Mr Cervical Spine W Wo Contrast  Result Date: 06/13/2018 CLINICAL DATA:  Initial evaluation for 2 week history of numbness and  tingling in hands and feet with gait imbalance. Evaluate for  possible multiple sclerosis. EXAM: MRI HEAD WITHOUT AND WITH CONTRAST MRI CERVICAL SPINE WITHOUT AND WITH CONTRAST TECHNIQUE: Multiplanar, multiecho pulse sequences of the brain and surrounding structures, and cervical spine, to include the craniocervical junction and cervicothoracic junction, were obtained without and with intravenous contrast. CONTRAST:  6 cc of Gadavist. COMPARISON:  Prior head CT from 06/12/2018. FINDINGS: MRI HEAD FINDINGS Brain: Cerebral volume within normal limits for patient age. No focal parenchymal signal abnormality identified. No abnormal foci of restricted diffusion to suggest acute or subacute ischemia. Gray-white matter differentiation well maintained. No encephalomalacia to suggest chronic infarction. No foci of susceptibility artifact to suggest acute or chronic intracranial hemorrhage. No mass lesion, midline shift or mass effect. No hydrocephalus. No extra-axial fluid collection. Major dural sinuses are grossly patent. Pituitary gland and suprasellar region are normal. Midline structures intact and normal. No abnormal enhancement. Vascular: Major intracranial vascular flow voids well maintained and normal in appearance. Skull and upper cervical spine: Craniocervical junction normal. Visualized upper cervical spine within normal limits. Bone marrow signal intensity normal. No scalp soft tissue abnormality. Sinuses/Orbits: Globes and orbital soft tissues within normal limits. Paranasal sinuses are clear. No mastoid effusion. Inner ear structures normal. Other: None. MRI CERVICAL SPINE FINDINGS Alignment: Straightening of the normal cervical lordosis without listhesis or subluxation. Vertebrae: Vertebral body heights maintained without evidence for acute or chronic fracture. Bone marrow signal intensity within normal limits. No discrete or worrisome osseous lesions. No abnormal marrow edema or enhancement. Cord: Signal intensity within the cervical spinal cord is normal. No cord  signal abnormality to suggest demyelinating disease or other abnormality. No abnormal enhancement. Normal cord caliber and morphology. Posterior Fossa, vertebral arteries, paraspinal tissues: Craniocervical junction within normal limits. Paraspinous and prevertebral soft tissues are normal. Normal intravascular flow voids seen within the vertebral arteries bilaterally. Disc levels: Minor annular disc bulging noted at C5-6 without stenosis. No other significant disc pathology identified. No significant facet degeneration. No canal or neural foraminal stenosis or evidence for neural impingement. IMPRESSION: 1. Normal brain MRI. No evidence for demyelinating disease or other acute abnormality. 2. Normal MRI of the cervical spine. No evidence for demyelinating disease or other abnormality. No significant disc pathology or stenosis. Electronically Signed   By: Jeannine Boga M.D.   On: 06/13/2018 20:15     Labs:   Basic Metabolic Panel: Recent Labs  Lab 06/12/18 0324 06/13/18 0315  NA 140 136  K 3.9 3.8  CL 103 101  CO2 24 27  GLUCOSE 96 76  BUN <5* <5*  CREATININE 0.61 0.51  CALCIUM 8.9 8.8*  MG  --  1.6*  PHOS  --  4.6   GFR Estimated Creatinine Clearance: 96.6 mL/min (by C-G formula based on SCr of 0.51 mg/dL). Liver Function Tests: Recent Labs  Lab 06/12/18 0324 06/13/18 0315  AST 103* 111*  ALT 56* 49*  ALKPHOS 79 72  BILITOT 0.5 1.3*  PROT 7.8 6.0*  ALBUMIN 3.6 2.8*   No results for input(s): LIPASE, AMYLASE in the last 168 hours. No results for input(s): AMMONIA in the last 168 hours. Coagulation profile No results for input(s): INR, PROTIME in the last 168 hours.  CBC: Recent Labs  Lab 06/12/18 0324 06/13/18 0315  WBC 4.0 4.9  NEUTROABS 1.8 2.7  HGB 11.4* 10.2*  HCT 34.0* 29.8*  MCV 99.4 98.0  PLT 408* 306   Cardiac Enzymes: No results for input(s): CKTOTAL, CKMB, CKMBINDEX, TROPONINI in  the last 168 hours. BNP: Invalid input(s): POCBNP CBG: No  results for input(s): GLUCAP in the last 168 hours. D-Dimer No results for input(s): DDIMER in the last 72 hours. Hgb A1c No results for input(s): HGBA1C in the last 72 hours. Lipid Profile No results for input(s): CHOL, HDL, LDLCALC, TRIG, CHOLHDL, LDLDIRECT in the last 72 hours. Thyroid function studies Recent Labs    06/12/18 0550  TSH 1.311   Anemia work up Recent Labs    06/12/18 0559  VITAMINB12 567   Microbiology Recent Results (from the past 240 hour(s))  SARS Coronavirus 2 (CEPHEID - Performed in [place] hospital lab), Hosp Order     Status: None   Collection Time: 06/12/18  5:20 AM  Result Value Ref Range Status   SARS Coronavirus 2 NEGATIVE NEGATIVE Final    Comment: (NOTE) If result is NEGATIVE SARS-CoV-2 target nucleic acids are NOT DETECTED. The SARS-CoV-2 RNA is generally detectable in upper and lower  respiratory specimens during the acute phase of infection. The lowest  concentration of SARS-CoV-2 viral copies this assay can detect is 250  copies / mL. A negative result does not preclude SARS-CoV-2 infection  and should not be used as the sole basis for treatment or other  patient management decisions.  A negative result may occur with  improper specimen collection / handling, submission of specimen other  than nasopharyngeal swab, presence of viral mutation(s) within the  areas targeted by this assay, and inadequate number of viral copies  (<250 copies / mL). A negative result must be combined with clinical  observations, patient history, and epidemiological information. If result is POSITIVE SARS-CoV-2 target nucleic acids are DETECTED. The SARS-CoV-2 RNA is generally detectable in upper and lower  respiratory specimens dur ing the acute phase of infection.  Positive  results are indicative of active infection with SARS-CoV-2.  Clinical  correlation with patient history and other diagnostic information is  necessary to determine patient infection  status.  Positive results do  not rule out bacterial infection or co-infection with other viruses. If result is PRESUMPTIVE POSTIVE SARS-CoV-2 nucleic acids MAY BE PRESENT.   A presumptive positive result was obtained on the submitted specimen  and confirmed on repeat testing.  While 2019 novel coronavirus  (SARS-CoV-2) nucleic acids may be present in the submitted sample  additional confirmatory testing may be necessary for epidemiological  and / or clinical management purposes  to differentiate between  SARS-CoV-2 and other Sarbecovirus currently known to infect humans.  If clinically indicated additional testing with an alternate test  methodology [phone]) is advised. The SARS-CoV-2 RNA is generally  detectable in upper and lower respiratory sp ecimens during the acute  phase of infection. The expected result is Negative. Fact Sheet for Patients:  StrictlyIdeas.no Fact Sheet for Healthcare Providers: BankingDealers.co.za This test is not yet approved or cleared by the [place] FDA and has been authorized for detection and/or diagnosis of SARS-CoV-2 by FDA under an Emergency Use Authorization (EUA).  This EUA will remain in effect (meaning this test can be used) for the duration of the COVID-19 declaration under Section 564(b)(1) of the Act, 21 U.S.C. section 360bbb-3(b)(1), unless the authorization is terminated or revoked sooner. Performed at [facility], [place] [address], [place], [place] [address]   MRSA PCR Screening     Status: None   Collection Time: 06/12/18 10:55 AM  Result Value Ref Range Status   MRSA by PCR NEGATIVE NEGATIVE Final    Comment:  The GeneXpert MRSA Assay (FDA approved for NASAL specimens only), is one component of a comprehensive MRSA colonization surveillance program. It is not intended to diagnose MRSA infection nor to guide or monitor treatment for MRSA  infections. Performed at [facility], [place] [address], [place], [place] [address]      Discharge Instructions:   Discharge Instructions    Ambulatory referral to Neurology   Complete by:  As directed    An appointment is requested in approximately: 2 weeks.  Patient has significant alcoholism in the past and currently drinks approximately 7 shots a night.  Now is presenting with significant neuropathy of lower extremities up to knees along with severe burning and neuropathy of the hands which progresses up to her wrist.  She has good reflexes at this time which rules out GBS.   Ambulatory referral to Physical Therapy   Complete by:  As directed    Diet - low sodium heart healthy   Complete by:  As directed    Discharge instructions   Complete by:  As directed    Follow up with her primary care physician within 1 week.  Follow-up with outpatient neurology clinic.  Clinic will contact you.  Can continue outpatient physical therapy.   Increase activity slowly   Complete by:  As directed      Allergies as of 06/14/2018   No Known Allergies     Medication List    STOP taking these medications   cephALEXin 500 MG capsule Commonly known as:  KEFLEX   feeding supplement (ENSURE ENLIVE) Liqd   potassium chloride SA 20 MEQ tablet Commonly known as:  K-DUR     TAKE these medications   folic acid 1 MG tablet Commonly known as:  FOLVITE Take 1 tablet (1 mg total) by mouth daily.   gabapentin 100 MG capsule Commonly known as:  Neurontin Take 2 capsules (200 mg total) by mouth 3 (three) times daily for 30 days. What changed:    how much to take  when to take this   ibuprofen 600 MG tablet Commonly known as:  ADVIL Take 1 tablet (600 mg total) by mouth every 6 (six) hours as needed for moderate pain. What changed:  reasons to take this   IRON PO Take 1 tablet by mouth daily.   oxyCODONE-acetaminophen 5-325 MG tablet Commonly known as:  PERCOCET/ROXICET Take 1  tablet by mouth daily as needed for severe pain.   pantoprazole 40 MG tablet Commonly known as:  PROTONIX Take 1 tablet (40 mg total) by mouth daily. Start taking on:  Jun 15, 2018   PROBIOTIC-10 PO Take 1 tablet by mouth daily.   promethazine 25 MG tablet Commonly known as:  PHENERGAN Take 1 tablet (25 mg total) by mouth every 6 (six) hours as needed for nausea.   thiamine 100 MG tablet Take 1 tablet (100 mg total) by mouth daily.       Time coordinating discharge: 39 minutes  Signed:  Jaedon Siler  [facility] 06/14/2018, 4:07 PM

## 2018-06-14 NOTE — Progress Notes (Signed)
Patient was discharged home by MD order; discharged instructions  review and give to patient with care notes; IV DIC; skin intact; patient will be escorted to the car by nurse tech via wheelchair.  

## 2018-06-14 NOTE — Progress Notes (Signed)
Patient HR is up again in 170s when she walked to the bathroom. Patient denied any discomfort. When sitting in bed, HR normal. MD made aware. Will continue to monitor.

## 2018-06-14 NOTE — Evaluation (Signed)
Physical Therapy Evaluation Patient Details Name: Maureen Hanna MRN: [id number] DOB: 1990/07/10 Today's Date: 06/14/2018   History of Present Illness  Pt is a 28 y.o. female admitted 06/12/18 with c/o 2-week history of numbness/tingling in hands and feet. MRI of brain and cervical spine normal; no evidence of demelinating disease or other abnormality. PMH includes CHF, alcohol abuse, OSA.    Clinical Impression  Pt presents with an overall decrease in functional mobility secondary to above. PTA, pt indep, lives alone and works full-time. Today, pt indep with mobility although demonstrates higher level balance deficits exacerbated by impaired bilateral feet sensation. Educ re: BLE therex/positioning/stretching, fall risk reduction strategies. Dynamic Gait Index score of 17/24 indicates increased risk for falls with higher level balance activities. Pt would benefit from continued acute PT services to maximize functional mobility and independence prior to d/c with outpatient neuro PT.     Follow Up Recommendations Outpatient PT(neuro)    Equipment Recommendations  None recommended by PT    Recommendations for Other Services       Precautions / Restrictions Precautions Precautions: Fall;Other (comment) Precaution Comments: Watch HR Restrictions Weight Bearing Restrictions: No      Mobility  Bed Mobility Overal bed mobility: Independent                Transfers Overall transfer level: Independent Equipment used: None                Ambulation/Gait Ambulation/Gait assistance: Independent Gait Distance (Feet): 500 Feet Assistive device: None Gait Pattern/deviations: Step-through pattern;Decreased stride length;Wide base of support;Ataxic Gait velocity: Decreased Gait velocity interpretation: <1.8 ft/sec, indicate of risk for recurrent falls General Gait Details: Slow, ataxic(?) gait; pt reports burning/numbness in feet, compensating with slow gait and wide BOS.  No overt LOB or instability when balance not challenged. Pt reports 2x R knee locking into extension  Stairs            Wheelchair Mobility    Modified Rankin (Stroke Patients Only)       Balance Overall balance assessment: Needs assistance   Sitting balance-Leahy Scale: Good       Standing balance-Leahy Scale: Good   Single Leg Stance - Right Leg: 0 Single Leg Stance - Left Leg: 0 Tandem Stance - Right Leg: 10 Tandem Stance - Left Leg: 5 Rhomberg - Eyes Opened: 30 Rhomberg - Eyes Closed: 5   High Level Balance Comments: Pt quick to lose balance when eyes closed, no matter her stance. Continues to say, "Oh I wouldn't have been able to do this before anyways." Reliant on UE support to safely pick object off floor Standardized Balance Assessment Standardized Balance Assessment : Dynamic Gait Index   Dynamic Gait Index Level Surface: Mild Impairment Change in Gait Speed: Mild Impairment Gait with Horizontal Head Turns: Mild Impairment Gait with Vertical Head Turns: Mild Impairment Gait and Pivot Turn: Mild Impairment Step Over Obstacle: Mild Impairment Step Around Obstacles: Normal Steps: Mild Impairment Total Score: 17       Pertinent Vitals/Pain Pain Assessment: Faces Faces Pain Scale: Hurts a little bit Pain Location: Bilateral hands/feet Pain Descriptors / Indicators: Numbness;Tightness;Tingling Pain Intervention(s): Monitored during session    Home Living Family/patient expects to be discharged to:: Private residence Living Arrangements: Alone Available Help at Discharge: Friend(s);Family;Available PRN/intermittently Type of Home: House Home Access: Elevator;Stairs to enter     Home Layout: Multi-level Home Equipment: None      Prior Function Level of Independence: Independent  Comments: Drives, work in Audiological scientist at [employer]; has been telecomuniting, but going into work 1x/wk since [employer]. Ran high school track; tries to stay active  now. 1x fall due to L-side knee weakness     Hand Dominance        Extremity/Trunk Assessment   Upper Extremity Assessment Upper Extremity Assessment: RUE deficits/detail;LUE deficits/detail RUE Deficits / Details: C/o numbness/tingling/burning in hands, up to wrists; decreased grip strength  RUE Coordination: decreased fine motor LUE Deficits / Details: C/o numbness/tingling/burning in hands, up to wrists; decreased grip strength  LUE Coordination: decreased fine motor    Lower Extremity Assessment Lower Extremity Assessment: RLE deficits/detail;LLE deficits/detail RLE Deficits / Details: R hip flex 4/5, knee flex 4/5, knee ext 5/5; pt reports "tightness" up to hamstring, especially in foot/calf RLE Sensation: (light touch intact) RLE Coordination: decreased gross motor LLE Deficits / Details: L hip flex 4/5, knee flex 4/5, knee ext 5/5; pt reports "tightness" up to hamstring, especially in foot/calf LLE Sensation: (light touch intact) LLE Coordination: decreased gross motor    Cervical / Trunk Assessment Cervical / Trunk Assessment: Normal  Communication   Communication: No difficulties  Cognition Arousal/Alertness: Awake/alert Behavior During Therapy: WFL for tasks assessed/performed Overall Cognitive Status: Within Functional Limits for tasks assessed                                        General Comments General comments (skin integrity, edema, etc.): HR up to 170s with mobility (RN aware and paged MD)    Exercises     Assessment/Plan    PT Assessment Patient needs continued PT services  PT Problem List Decreased strength;Decreased balance;Decreased mobility;Impaired sensation       PT Treatment Interventions DME instruction;Gait training;Stair training;Functional mobility training;Therapeutic activities;Balance training;Therapeutic exercise;Patient/family education    PT Goals (Current goals can be found in the Care Plan section)  Acute  Rehab PT Goals Patient Stated Goal: "Figure out what is wrong with me" PT Goal Formulation: With patient Time For Goal Achievement: 06/28/18 Potential to Achieve Goals: Good    Frequency Min 3X/week   Barriers to discharge Decreased caregiver support      Co-evaluation               AM-PAC PT "6 Clicks" Mobility  Outcome Measure Help needed turning from your back to your side while in a flat bed without using bedrails?: None Help needed moving from lying on your back to sitting on the side of a flat bed without using bedrails?: None Help needed moving to and from a bed to a chair (including a wheelchair)?: None Help needed standing up from a chair using your arms (e.g., wheelchair or bedside chair)?: None Help needed to walk in hospital room?: None Help needed climbing 3-5 steps with a railing? : A Little 6 Click Score: 23    End of Session Equipment Utilized During Treatment: Gait belt Activity Tolerance: Patient tolerated treatment well Patient left: in chair;with call bell/phone within reach Nurse Communication: Mobility status PT Visit Diagnosis: Other abnormalities of gait and mobility (R26.89);Difficulty in walking, not elsewhere classified (R26.2)    Time: 4403-4742 PT Time Calculation (min) (ACUTE ONLY): 35 min   Charges:   PT Evaluation $PT Eval Low Complexity: 1 Low PT Treatments $Gait Training: 8-22 mins      Ina Homes, PT, DPT Acute Rehabilitation Services  Pager [phone] Office [phone]  Malachy ChamberJaclyn L Angelie Kram 06/14/2018, 10:10 AM

## 2018-06-14 NOTE — Progress Notes (Addendum)
NEUROLOGY PROGRESS NOTE  Subjective: Patient feels that her hands have improved slightly to the point where she is now able to hold things without severe burning however she still feels as though it is hard to flex her fingers.  Yesterday she felt the Neurontin was helping significantly however today she feels no difference.  Exam: Vitals:   06/14/18 0611 06/14/18 0736  BP: (!) 134/97 (!) 136/105  Pulse: 84 89  Resp: 18 18  Temp: 98.9 F (37.2 C) 99 F (37.2 C)  SpO2: 99% 99%    Physical Exam   HEENT-  Normocephalic, no lesions, without obvious abnormality.  Normal external eye and conjunctiva.   Cardiovascular- S1-S2 audible, pulses palpable throughout       Neuro:  Mental Status: Alert, oriented, thought content appropriate.  Speech fluent without evidence of aphasia.  Able to follow 3 step commands without difficulty. Cranial Nerves: II:  Visual fields grossly normal,  III,IV, VI: ptosis not present, extra-ocular motions intact bilaterally pupils equal, round, reactive to light and accommodation V,VII: smile symmetric, facial light touch sensation normal bilaterally VIII: hearing normal bilaterally IX,X: Palate rises midline XI: bilateral shoulder shrug XII: midline tongue extension Motor: Right : Upper extremity   5/5    Left:     Upper extremity   5/5  Lower extremity   5/5     Lower extremity   5/5 Tone and bulk:normal tone throughout; no atrophy noted Sensory: No change in sensory at this time from previous note:  Pinprick equal bilaterally in the upper extremities.  As far as lower extremities she has stocking distribution pain sensation deficit which normalizes approximately halfway up her tibia.  Light touch is bilaterally equal.  Temperature sensation diminished in stocking distribution, normalizes at approximately knee level.  Vibratory sensation is not felt bilaterally at the toes or ankles, but is felt at the knees.  Proprioception unremarkable when the toe is  down but cannot sense position when toe is passively dorsiflexed.  Deep Tendon Reflexes: 2+ and symmetric throughout Plantars: Right: downgoing   Left: downgoing   Medications:  Scheduled: . folic acid  1 mg Oral Daily  . gabapentin  100 mg Oral TID  . multivitamin with minerals  1 tablet Oral Daily  . pantoprazole  40 mg Oral Daily    Pertinent Labs/Diagnostics:   Mr Jeri Cos Wo Contrast/MRI of cervical spine with and without contrast   IMPRESSION: 1. Normal brain MRI. No evidence for demyelinating disease or other acute abnormality. 2. Normal MRI of the cervical spine. No evidence for demyelinating disease or other abnormality. No significant disc pathology or stenosis. Electronically Signed   By: Jeannine Boga M.D.   On: 06/13/2018 20:15     Etta Quill PA-C [facility] [phone]  Assessment: 28 year old female with past medical history of significant alcohol abuse presents to the ED with a 3-week history of bilateral numbness, tingling and burning sensations of both feet and hands, in conjunction with difficulty walking. 1. On examination patient does have impaired proprioception and slightly reduced sensation to light touch in stocking distribution, as well as decreased temperature sensation up to her knees and in her hands to wrist level. Absent achilles reflexes. 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   2. Labs ordered at [facility] included B1, methylmalonic acid, ESR and TSH.  TSH was normal. ESR was mildly elevated.  B12 was normal at 567. B1 and copper levels are pending.  3. Given her subacute presentation and absence of preceding viral illness, there is low suspicion for Iline Oven syndrome (AIDP). 4. Spinal cord lesion ruled out with negative cervical spine MRI. No evidence for MS.  5. Stroke or other acute/subacute process ruled out with MRI brain. No evidence for MS.  6. Most  likely etiology for her sensory polyneuropathy is felt to be chronic EtOH abuse. Thiamine deficiency also on the DDx.   Recommendations: - PT - Pending B1 level - Outpatient EMG with nerve conduction study - Started high-dose empiric thiamine yesterday at 500 mg IV TID x 3 days. After regimen is completed, start po thiamine at 100 mg qd.  -- Neurology will sign off. Please call if there are additional questions.    Electronically signed: Dr. Kerney Elbe 06/14/2018, 10:29 AM

## 2018-06-14 NOTE — Progress Notes (Signed)
Patient is working with PT at this time; HR elevated in 160s-170s sustained. Patient is not complaining of any distress. Even after few steps HR was going up to 140s. MD made aware.

## 2018-06-15 LAB — VITAMIN B1
Vitamin B1 (Thiamine): 85.3 nmol/L (ref 66.5–200.0)
Vitamin B1 (Thiamine): 104.6 nmol/L (ref 66.5–200.0)

## 2018-06-16 ENCOUNTER — Telehealth: Payer: Self-pay | Admitting: Diagnostic Neuroimaging

## 2018-06-16 ENCOUNTER — Encounter: Payer: Self-pay | Admitting: Diagnostic Neuroimaging

## 2018-06-16 NOTE — Telephone Encounter (Signed)
Pt gave consent for VV on the phone/ Pt understands that although there may be some limitations with this type of visit, we will take all precautions to reduce any security or privacy concerns.  Pt understands that this will be treated like an in office visit and we will file with pt's insurance, and there may be a patient responsible charge related to this service. Sent e-mail to pt w link  [email or web address]

## 2018-06-16 NOTE — Telephone Encounter (Signed)
Spoke with patient and updated EMR. 

## 2018-06-18 LAB — METHYLMALONIC ACID(MMA), RND URINE
Creatinine(Crt), U: 1.55 g/L (ref 0.30–3.00)
MMA - Normalized: 0.8 umol/mmol cr (ref 0.5–3.4)
Methylmalonic Acid, Ur: 10.9 umol/L (ref 1.6–29.7)

## 2018-06-20 ENCOUNTER — Encounter: Payer: Self-pay | Admitting: Diagnostic Neuroimaging

## 2018-06-20 ENCOUNTER — Other Ambulatory Visit: Payer: Self-pay

## 2018-06-20 ENCOUNTER — Ambulatory Visit (INDEPENDENT_AMBULATORY_CARE_PROVIDER_SITE_OTHER): Payer: BC Managed Care – PPO | Admitting: Diagnostic Neuroimaging

## 2018-06-20 DIAGNOSIS — G629 Polyneuropathy, unspecified: Secondary | ICD-10-CM

## 2018-06-20 MED ORDER — GABAPENTIN 300 MG PO CAPS: 300.0000 mg | ORAL_CAPSULE | Freq: Three times a day (TID) | ORAL | 6 refills | Status: AC

## 2018-06-20 NOTE — Progress Notes (Signed)
[facility]  PATIENT: Maureen Hanna DOB: Mar 28, 1990  REFERRING CLINICIAN: D Smith HISTORY FROM: patient  REASON FOR VISIT: new consult    HISTORICAL  CHIEF COMPLAINT:  Chief Complaint  Patient presents with   Pain   Numbness    HISTORY OF PRESENT ILLNESS:   28 year old female here for evaluation of numbness and pain in hands and feet.  Symptoms started about 2 months ago.  She describes progressive numbness, tingling, burning, swelling and pain in her hands, arms, knees and legs.  Patient has been to the PCP, ER has had extensive testing and evaluation.  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  She was started on gabapentin 100 mg twice a day relief.  Patient reports she has significantly cut down alcohol use in the last few weeks.   REVIEW OF SYSTEMS: Full 14 system review of systems performed and negative with exception of: As per HPI.  ALLERGIES: No Known Allergies  HOME MEDICATIONS: Outpatient Medications Prior to Visit  Medication Sig Dispense Refill   Ferrous Sulfate (IRON PO) Take 1 tablet by mouth daily.     folic acid (FOLVITE) 1 MG tablet Take 1 tablet (1 mg total) by mouth daily. 30 tablet 2   gabapentin (NEURONTIN) 100 MG capsule Take 2 capsules (200 mg total) by mouth 3 (three) times daily for 30 days. 180 capsule 2   ibuprofen (ADVIL) 600 MG tablet Take 1 tablet (600 mg total) by mouth every 6 (six) hours as needed for moderate pain. 30 tablet 0   Multiple Vitamin (MULTIVITAMIN) tablet Take 1 tablet by mouth daily.     oxyCODONE-acetaminophen (PERCOCET/ROXICET) 5-325 MG tablet Take 1 tablet by mouth daily as needed for severe pain. 10 tablet 0   pantoprazole (PROTONIX) 40 MG tablet Take 1 tablet (40 mg total) by mouth daily. (Patient not taking: Reported on 06/16/2018) 30 tablet 0   Probiotic Product (PROBIOTIC-10 PO) Take 1 tablet by mouth daily.     promethazine (PHENERGAN) 25 MG tablet Take 1  tablet (25 mg total) by mouth every 6 (six) hours as needed for nausea. (Patient not taking: Reported on 06/12/2018) 20 tablet 0   thiamine 100 MG tablet Take 1 tablet (100 mg total) by mouth daily. 30 tablet 3   No facility-administered medications prior to visit.     PAST MEDICAL HISTORY: Past Medical History:  Diagnosis Date   Alcohol abuse    Alcoholic hepatitis    CHF (congestive heart failure) (HCC)    Dehydration    GERD (gastroesophageal reflux disease)    Sleep apnea    "mask hasn't been ordered" (09/29/2016)    PAST SURGICAL HISTORY: Past Surgical History:  Procedure Laterality Date   NO PAST SURGERIES      FAMILY HISTORY: Family History  Problem Relation Age of Onset   Liver disease Neg Hx     SOCIAL HISTORY: Social History   Socioeconomic History   Marital status: Significant Other    Spouse name: Not on file   Number of children: 0   Years of education: Not on file   Highest education level: Bachelor's degree (e.g., BA, AB, BS)  Occupational History   Not on file  Social Needs   Financial resource strain: Not on file   Food insecurity:    Worry: Not on file    Inability: Not on file   Transportation needs:    Medical: Not on file    Non-medical: Not on file  Tobacco Use  Smoking status: Never Smoker   Smokeless tobacco: Never Used  Substance and Sexual Activity   Alcohol use: Yes    Comment: occ   Drug use: No   Sexual activity: Yes    Birth control/protection: None  Lifestyle   Physical activity:    Days per week: Not on file    Minutes per session: Not on file   Stress: Not on file  Relationships   Social connections:    Talks on phone: Not on file    Gets together: Not on file    Attends religious service: Not on file    Active member of club or organization: Not on file    Attends meetings of clubs or organizations: Not on file    Relationship status: Not on file   Intimate partner violence:    Fear of  current or ex partner: Not on file    Emotionally abused: Not on file    Physically abused: Not on file    Forced sexual activity: Not on file  Other Topics Concern   Not on file  Social History Narrative   Caffeine none     PHYSICAL EXAM  .     DIAGNOSTIC DATA (LABS, IMAGING, TESTING) - I reviewed patient records, labs, notes, testing and imaging myself where available.  Lab Results  Component Value Date   WBC 4.9 06/13/2018   HGB 10.2 (L) 06/13/2018   HCT 29.8 (L) 06/13/2018   MCV 98.0 06/13/2018   PLT 306 06/13/2018      Component Value Date/Time   NA 136 06/13/2018 0315   K 3.8 06/13/2018 0315   CL 101 06/13/2018 0315   CO2 27 06/13/2018 0315   GLUCOSE 76 06/13/2018 0315   BUN <5 (L) 06/13/2018 0315   CREATININE 0.51 06/13/2018 0315   CALCIUM 8.8 (L) 06/13/2018 0315   PROT 6.0 (L) 06/13/2018 0315   ALBUMIN 2.8 (L) 06/13/2018 0315   AST 111 (H) 06/13/2018 0315   ALT 49 (H) 06/13/2018 0315   ALKPHOS 72 06/13/2018 0315   BILITOT 1.3 (H) 06/13/2018 0315   GFRNONAA >60 06/13/2018 0315   GFRAA >60 06/13/2018 0315   No results found for: CHOL, HDL, LDLCALC, LDLDIRECT, TRIG, CHOLHDL No results found for: HGBA1C Lab Results  Component Value Date   [id number] 754 06/12/2018   Lab Results  Component Value Date   TSH 1.311 06/12/2018    06/10/15 A1c 4.6  06/13/18 [I reviewed images myself and agree with interpretation. -VRP]  1. Normal brain MRI. No evidence for demyelinating disease or other acute abnormality. 2. Normal MRI of the cervical spine. No evidence for demyelinating disease or other abnormality. No significant disc pathology or stenosis.   ASSESSMENT AND PLAN  28 y.o. 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  Will check other lab testing to rule out other causes of neuropathy.   Dx:  1. Neuropathy    Virtual Visit via Video Note  I  connected with Conception Chancy on 06/20/18 at  9:00 AM EDT by a video enabled telemedicine application and verified that I am speaking with the correct person using two identifiers.  Location: Patient: home  Provider: office   I discussed the limitations of evaluation and management by telemedicine and the availability of in person appointments. The patient expressed understanding and agreed to proceed.   I discussed the assessment and treatment plan with the patient.  The patient was provided an opportunity to ask questions and all were answered. The patient agreed with the plan and demonstrated an understanding of the instructions.   The patient was advised to call back or seek an in-person evaluation if the symptoms worsen or if the condition fails to improve as anticipated.  I provided 35 minutes of non-face-to-face time during this encounter.   PLAN:  NEUROPATHY (possibly alcoholic) - check neuropathy labs - increase gabapentin up to 319m three times a day - may consider duloxetine in future  Orders Placed This Encounter  Procedures   ANA,IFA RA Diag Pnl w/rflx Tit/Patn   Multiple Myeloma Panel (SPEP&IFE w/QIG)   Angiotensin converting enzyme   Hemoglobin A1c   Meds ordered this encounter  Medications   gabapentin (NEURONTIN) 300 MG capsule    Sig: Take 1 capsule (300 mg total) by mouth 3 (three) times daily.    Dispense:  90 capsule    Refill:  6   Return in about 3 months (around 09/20/2018) for with NP (Amy Lomax).    VPenni Bombard MD 60/04/8887 91:69AM Certified in Neurology, Neurophysiology and Neuroimaging  GB[facility] [address] [place] [phone]

## 2018-07-19 ENCOUNTER — Other Ambulatory Visit (INDEPENDENT_AMBULATORY_CARE_PROVIDER_SITE_OTHER): Payer: Self-pay

## 2018-07-19 ENCOUNTER — Telehealth: Payer: Self-pay | Admitting: *Deleted

## 2018-07-19 ENCOUNTER — Other Ambulatory Visit: Payer: Self-pay

## 2018-07-19 DIAGNOSIS — Z0289 Encounter for other administrative examinations: Secondary | ICD-10-CM

## 2018-07-19 NOTE — Telephone Encounter (Signed)
Called patient and reminded her of labs to be drawn. She stated she will come today. I gave her address and lab hours. She will schedule her FY after she gets lab results. She verbalized understanding, appreciation.

## 2018-07-20 LAB — MULTIPLE MYELOMA PANEL, SERUM

## 2018-07-25 LAB — MULTIPLE MYELOMA PANEL, SERUM
Albumin SerPl Elph-Mcnc: 3.6 g/dL (ref 2.9–4.4)
Albumin/Glob SerPl: 1.2 (ref 0.7–1.7)
Alpha 1: 0.2 g/dL (ref 0.0–0.4)
Alpha2 Glob SerPl Elph-Mcnc: 0.8 g/dL (ref 0.4–1.0)
B-Globulin SerPl Elph-Mcnc: 0.8 g/dL (ref 0.7–1.3)
Gamma Glob SerPl Elph-Mcnc: 1.2 g/dL (ref 0.4–1.8)
Globulin, Total: 3.1 g/dL (ref 2.2–3.9)
IgA/Immunoglobulin A, Serum: 279 mg/dL (ref 87–352)
IgG (Immunoglobin G), Serum: 1301 mg/dL (ref 586–1602)
IgM (Immunoglobulin M), Srm: 121 mg/dL (ref 26–217)
Total Protein: 6.7 g/dL (ref 6.0–8.5)

## 2018-07-25 LAB — HEMOGLOBIN A1C
Est. average glucose Bld gHb Est-mCnc: 91 mg/dL
Hgb A1c MFr Bld: 4.8 % (ref 4.8–5.6)

## 2018-07-25 LAB — ANA,IFA RA DIAG PNL W/RFLX TIT/PATN
ANA Titer 1: NEGATIVE
Cyclic Citrullin Peptide Ab: 30 units — ABNORMAL HIGH (ref 0–19)
Rheumatoid fact SerPl-aCnc: <10 IU/mL (ref 0.0–13.9)

## 2018-07-25 LAB — ANGIOTENSIN CONVERTING ENZYME: Angio Convert Enzyme: 70 U/L (ref 14–82)

## 2018-07-27 ENCOUNTER — Telehealth: Payer: Self-pay | Admitting: *Deleted

## 2018-07-27 MED ORDER — DULOXETINE HCL 30 MG PO CPEP: 30.0000 mg | ORAL_CAPSULE | Freq: Every day | ORAL | 6 refills | Status: AC

## 2018-07-27 NOTE — Telephone Encounter (Signed)
Called patient and informed her of new Rx sent in. I advised her per Dr Leta Baptist she is to continue taking gabapentin as before. She asked if she should take the same amount; I advised that she may per Dr Leta Baptist. I advised she needs ot give new medication at least 4 weeks to determine it's effectiveness. She  verbalized understanding, appreciation.

## 2018-07-27 NOTE — Addendum Note (Signed)
Addended by: Andrey Spearman R on: 07/27/2018 04:22 PM   Modules accepted: Orders

## 2018-07-27 NOTE — Telephone Encounter (Signed)
Patient called back, she did not listen to her VM but I relayed message below and she requested to speak with the nurse because she has additional questions and she states that she "does not feel unremarkable". Please call and advise.

## 2018-07-27 NOTE — Telephone Encounter (Signed)
LVM informing patient that her lab results are unremarkable. I advised she was to increase gabapentin, and requested she call back if she has questions and to schedule her 3 month FU with NP.

## 2018-07-27 NOTE — Telephone Encounter (Addendum)
Called patient who had several questions about her Hgb A1c lab. We discussed to her satisfaction. She stated that she has a lot of pain at night especially. She is taking Gabapentin 600 mg in morning, 300 mg at lunch, 600-900 mg at night. She reported minimal relief. I advised her that is not the dosage prescribed by Dr Leta Baptist.  She stated that she "can't live like this". She was unclear to me about also receiving medication from her PCP for pain. She is requesting another appointment with Dr Leta Baptist. I advised her I will discuss her concerns with provider and call her back. She verbalized understanding, appreciation.

## 2018-07-27 NOTE — Telephone Encounter (Signed)
Patient called back asking for update. I advised her that her call was discussed with Dr Leta Baptist who reviewed notes from [facility] providers. I advised her he stated she may consider a second opinion for her neuropathy. She may also consider changing from gabapentin  to duloxetine. She stated she wants to try the new medication and wonders if the new Rx can be sent today. I advised her Dr Leta Baptist will most likely need to put her on a taper schedule to come off Gabapentin. I advised will let him know she wants to try new medication and will call her back with instructions. She Patient verbalized understanding, appreciation.

## 2018-07-27 NOTE — Telephone Encounter (Signed)
Will add duloxetine (cymbalta) 30mg  daily; try for at least 4 weeks. Continue gabapentin also. -VRP

## 2018-10-01 IMAGING — CT CT ABD-PELV W/ CM
2 of 4 series · 15 of 46 positions shown, 17 images · IV contrast (APPLIED)
Comparison: 09/17/2016

CLINICAL DATA: Nausea vomiting

EXAM:
CT ABDOMEN AND PELVIS WITH CONTRAST
TECHNIQUE: Multidetector CT imaging of the abdomen and pelvis was performed
using the standard protocol following bolus administration of
intravenous contrast.
CONTRAST:  100mL TBHMMS-LFF IOPAMIDOL (TBHMMS-LFF) INJECTION 61%

[Series 2: axial st · axial · 0.62mm/px · z∈[-346,+54]mm · 12 of 89 slices shown, 14 images]
[im 5/89  soft-tissue]
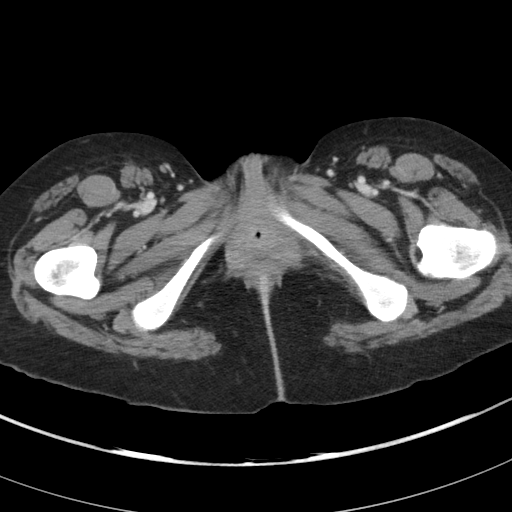
[im 5/89  bone]
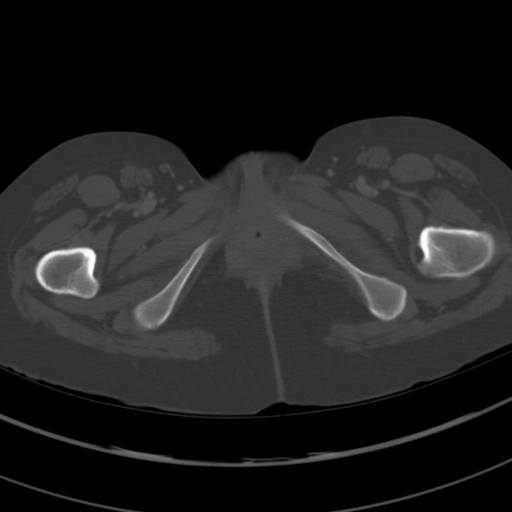
[im 13/89  soft-tissue]
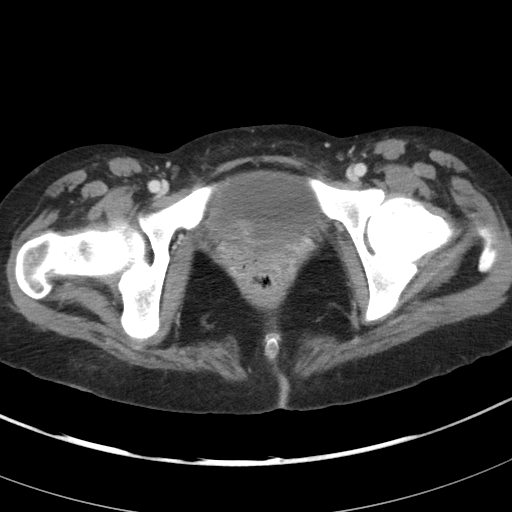
[im 21/89  soft-tissue]
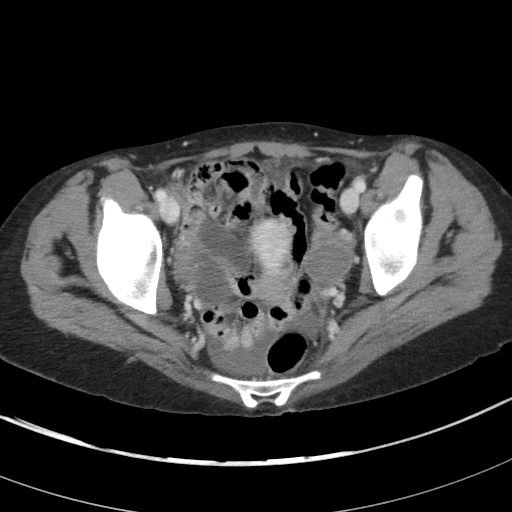
[im 29/89  soft-tissue]
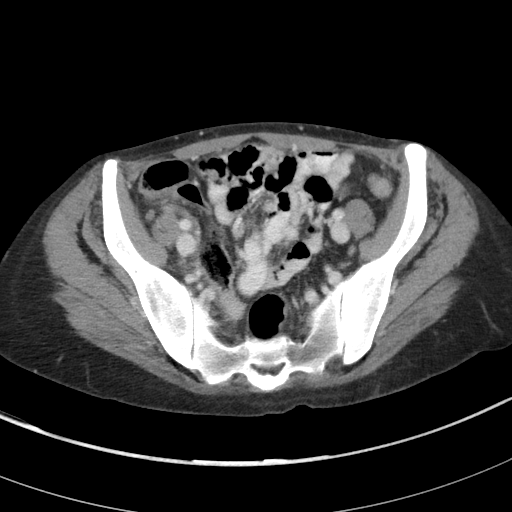
[im 33/89  soft-tissue]
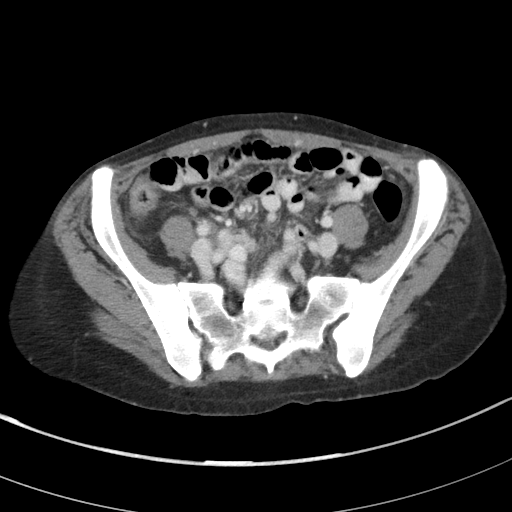
[im 41/89  soft-tissue]
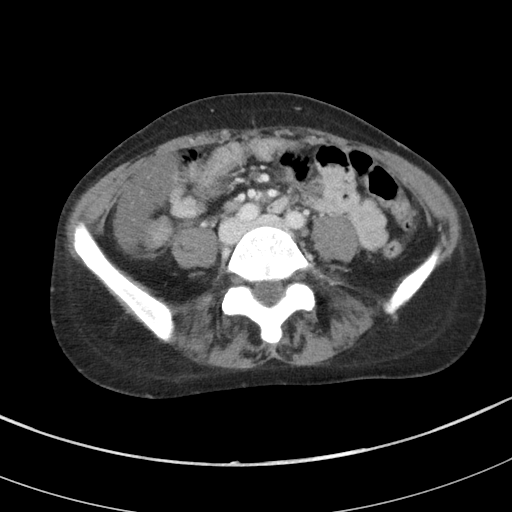
[im 49/89  soft-tissue]
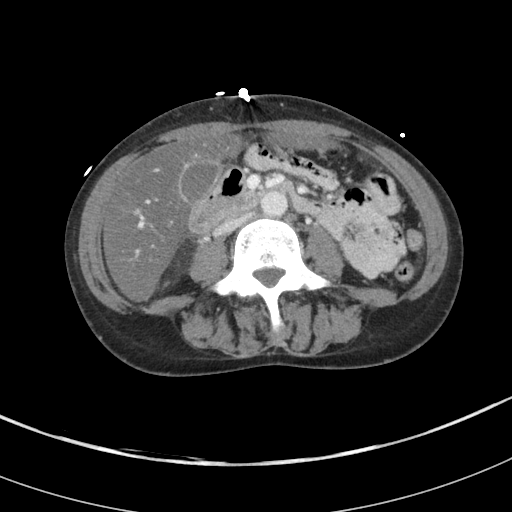
[im 57/89  soft-tissue]
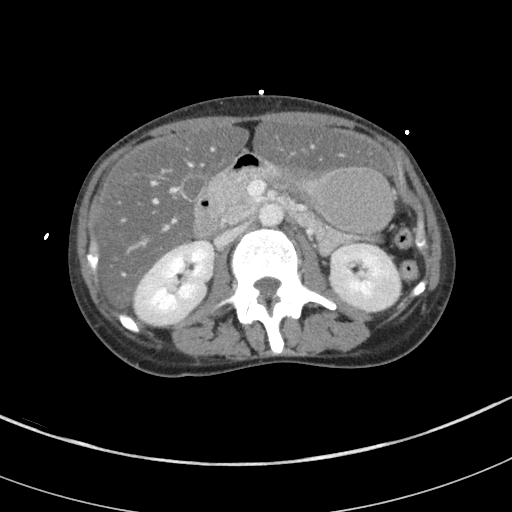
[im 61/89  soft-tissue]
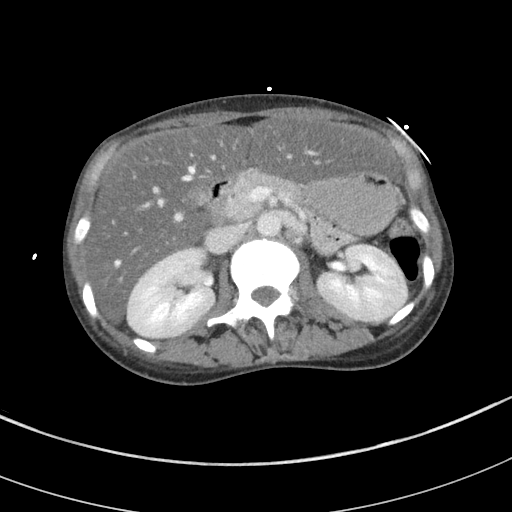
[im 61/89  bone]
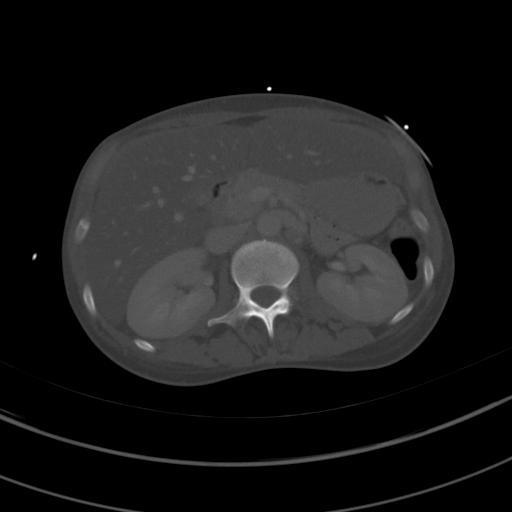
[im 69/89  soft-tissue]
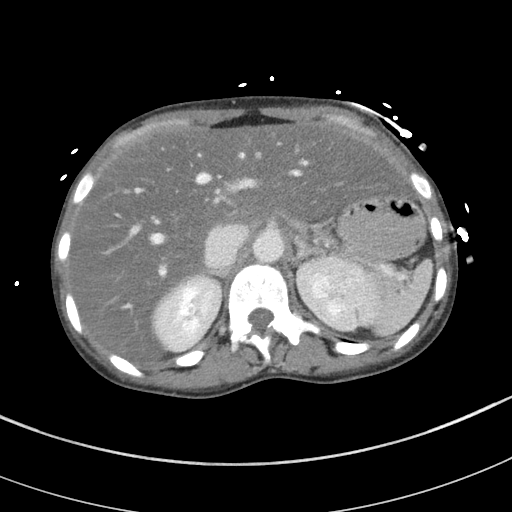
[im 77/89  soft-tissue]
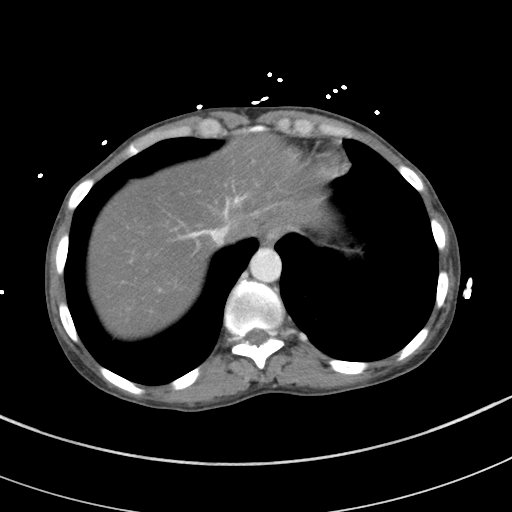
[im 85/89  soft-tissue]
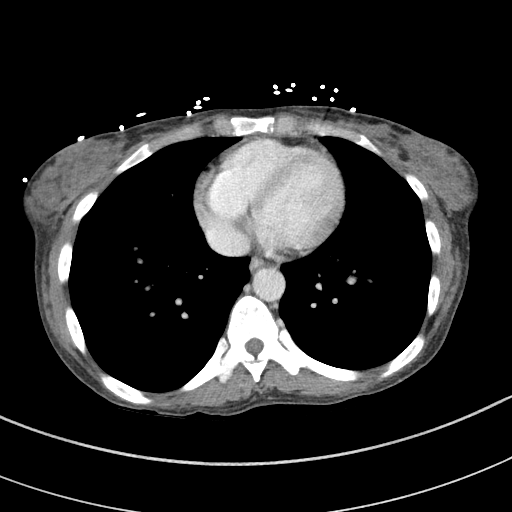

[Series 6: coronal st · coronal · 0.66mm/px · 3 of 66 slices shown]
[im 22/66  soft-tissue]
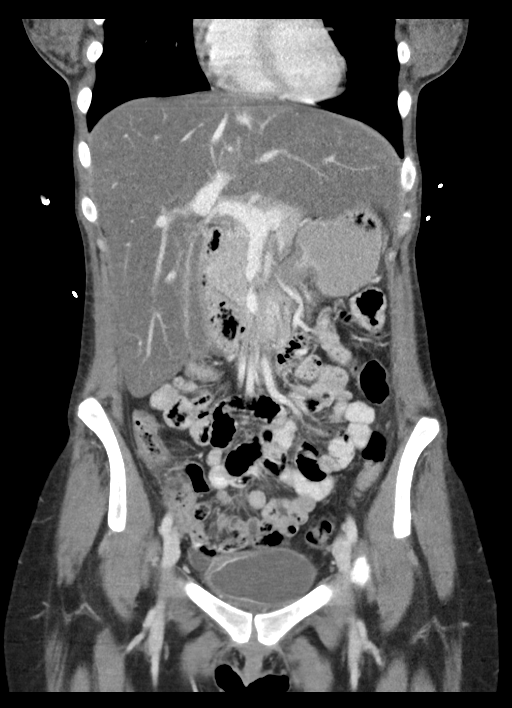
[im 29/66  soft-tissue]
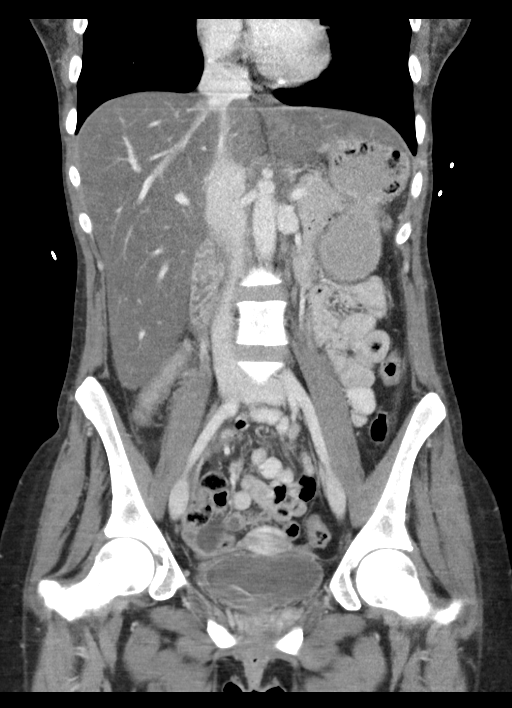
[im 37/66  soft-tissue]
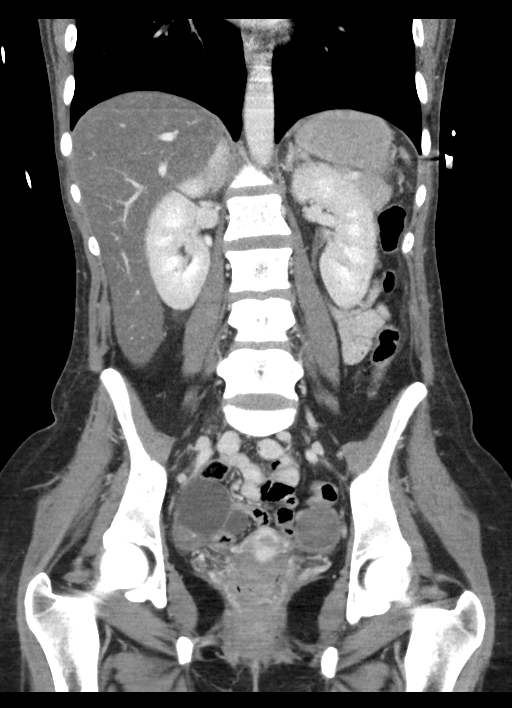

[15 of 46 positions shown; findings below may reference images not displayed]

FINDINGS: Lower chest: Lung bases demonstrate no acute consolidation or
pleural effusion. Normal heart size.

Hepatobiliary: Enlarged fatty liver measuring up to 20 cm. No focal
abnormality. No calcified gallstone or biliary dilatation

Pancreas: Unremarkable. No pancreatic ductal dilatation or
surrounding inflammatory changes.

Spleen: Normal in size without focal abnormality.

Adrenals/Urinary Tract: Adrenal glands are unremarkable. Kidneys are
normal, without renal calculi, focal lesion, or hydronephrosis.
Bladder is unremarkable.

Stomach/Bowel: Stomach is nonenlarged. No dilated small bowel.
Suspected mild wall thickening of the ascending colon and proximal
transverse colon. Small amount of fluid in the right paracolic
gutter. Appendix within normal limits.

Vascular/Lymphatic: No significant vascular findings are present. No
enlarged abdominal or pelvic lymph nodes.

Reproductive: Uterus is unremarkable. 2.6 cm intermediate density
lesion left adnexa. Focal water density in the right pelvis which
appears contiguous with a tubular structure, suspect that this is a
hydrosalpinx, dilated tube measures up to 14 mm. 4 cm possible cyst
in the right ovary.

Other: Small free fluid in the pelvis.  Negative for free air.

Musculoskeletal: No acute or significant osseous findings.
IMPRESSION: 1. Suspicion of mild colon wall thickening/colitis of the ascending
and transverse colon. Negative appendix.
2. Enlarged fatty liver
3. Right-sided hydrosalpinx. Intermediate density left adnexal
lesion which may be correlated with a nonemergent pelvic ultrasound.
4. Small amount of free fluid in the pelvis.

## 2019-07-27 ENCOUNTER — Other Ambulatory Visit: Payer: Self-pay | Admitting: Diagnostic Neuroimaging

## 2020-08-08 IMAGING — US US ABDOMEN LIMITED
1 series · 14 of 25 positions shown · non-contrast
Comparison: Abdomen and pelvis CT dated 09/17/2016 and limited
right upper quadrant abdomen ultrasound dated 09/17/2016.

CLINICAL DATA: Vomiting.

EXAM:
ULTRASOUND ABDOMEN LIMITED RIGHT UPPER QUADRANT

[Series 1: us abdomen limited · 0.13mm/px · 14 of 72 slices shown]
[im 1/72]
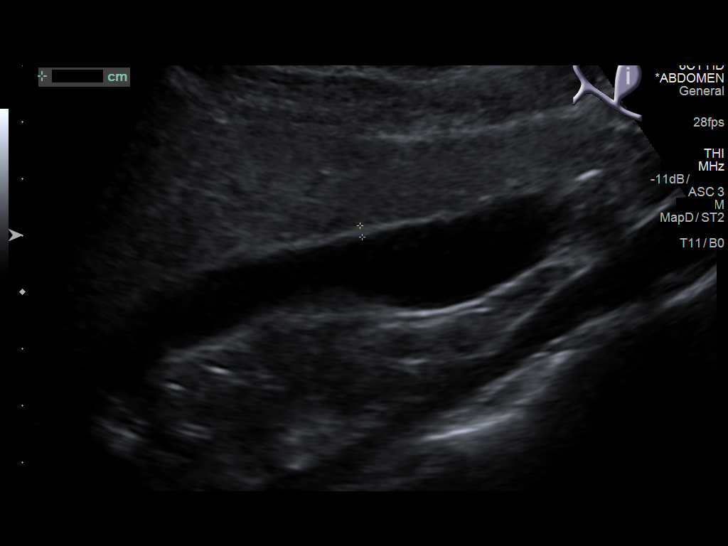
[im 6/72]
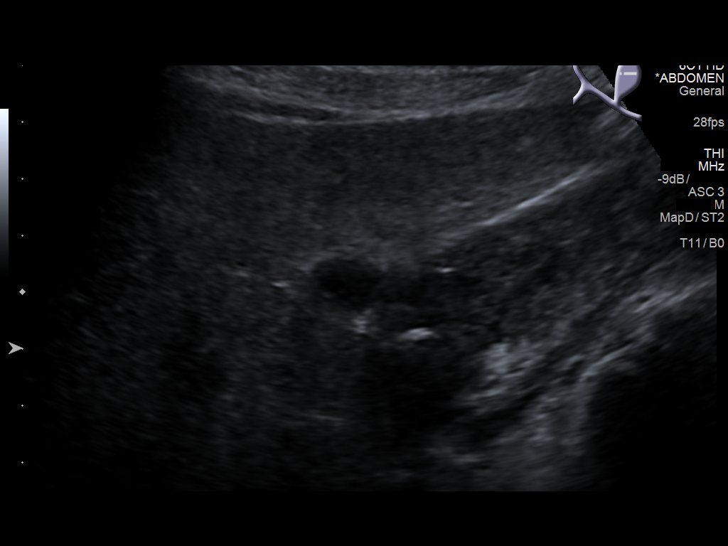
[im 12/72]
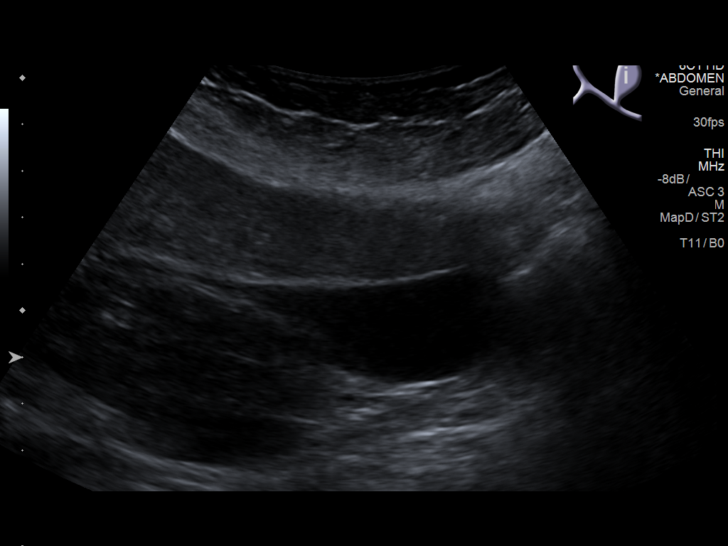
[im 18/72]
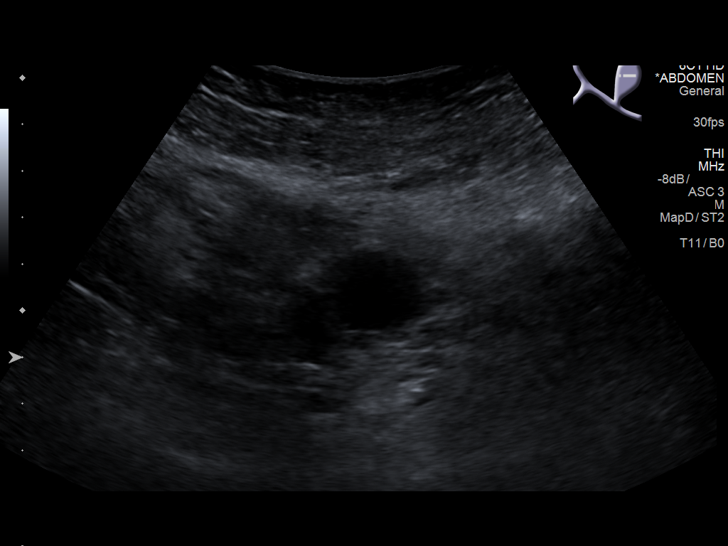
[im 24/72]
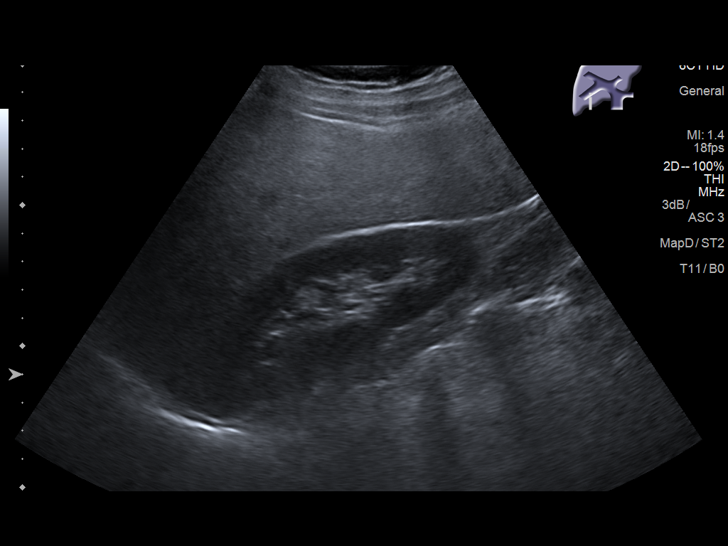
[im 27/72]
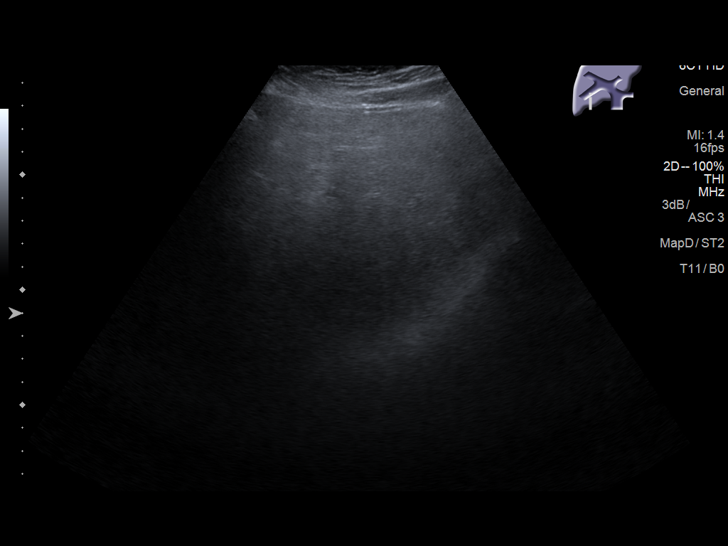
[im 33/72]
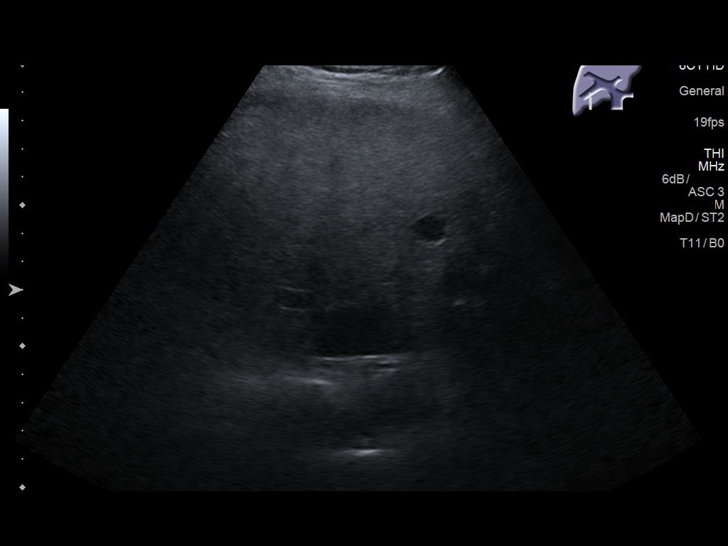
[im 39/72]
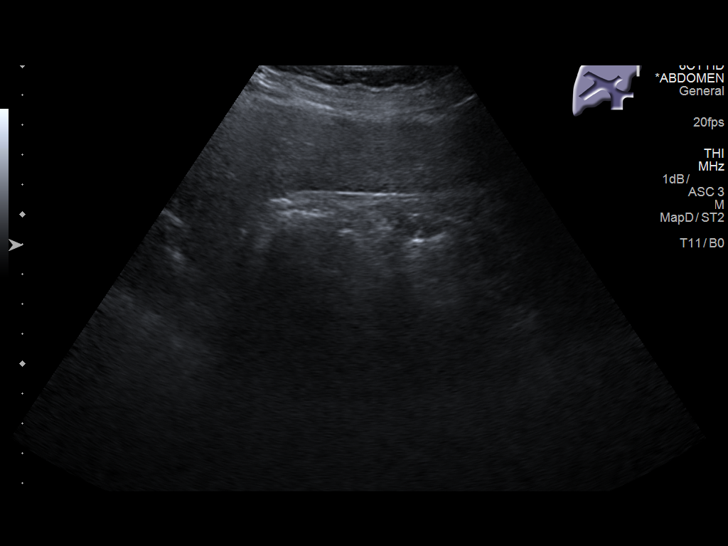
[im 45/72]
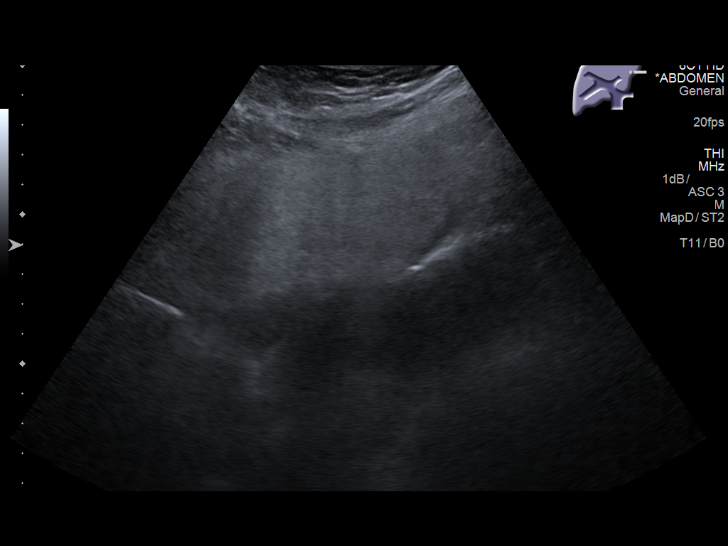
[im 48/72]
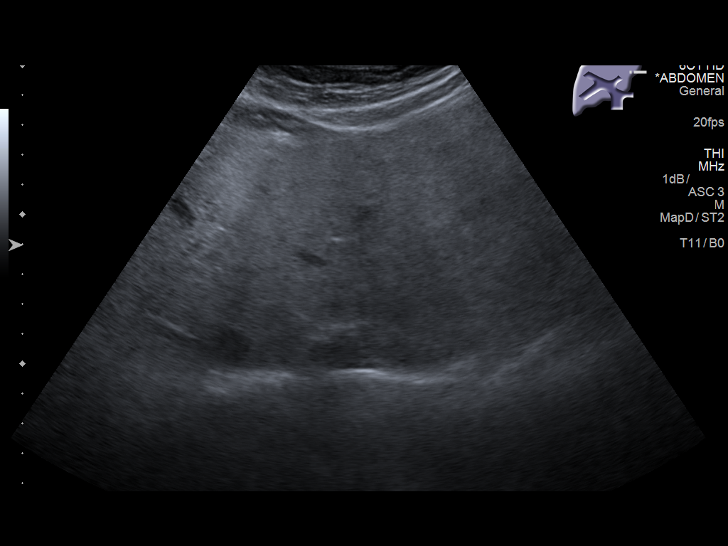
[im 54/72]
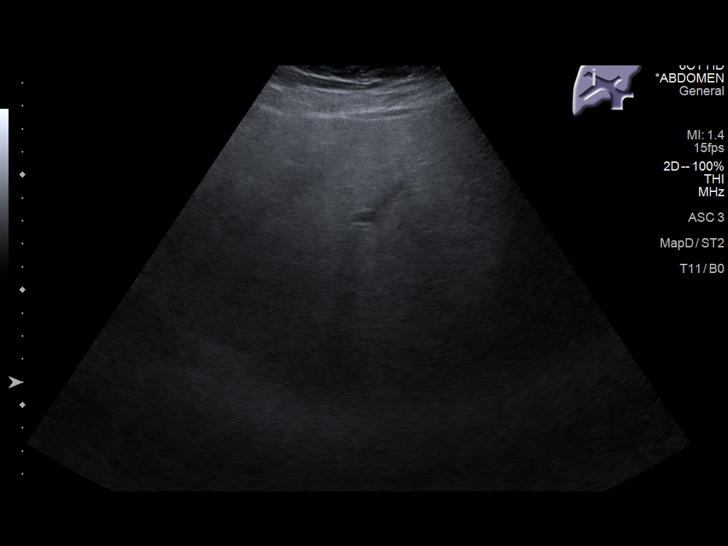
[im 60/72]
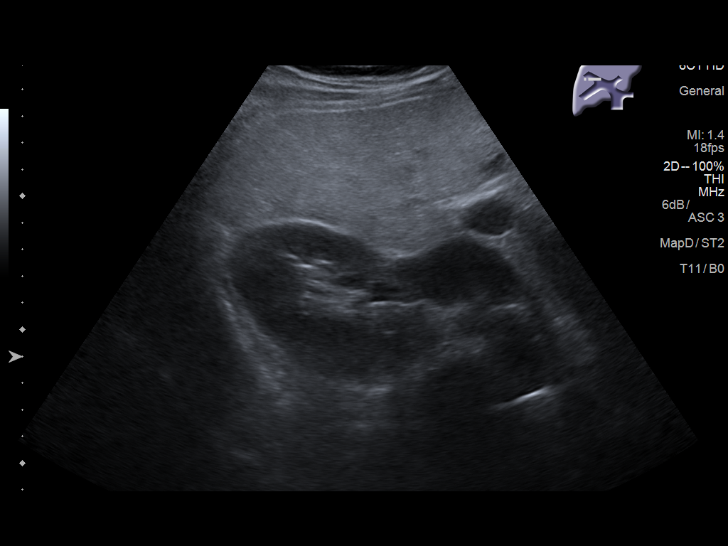
[im 66/72]
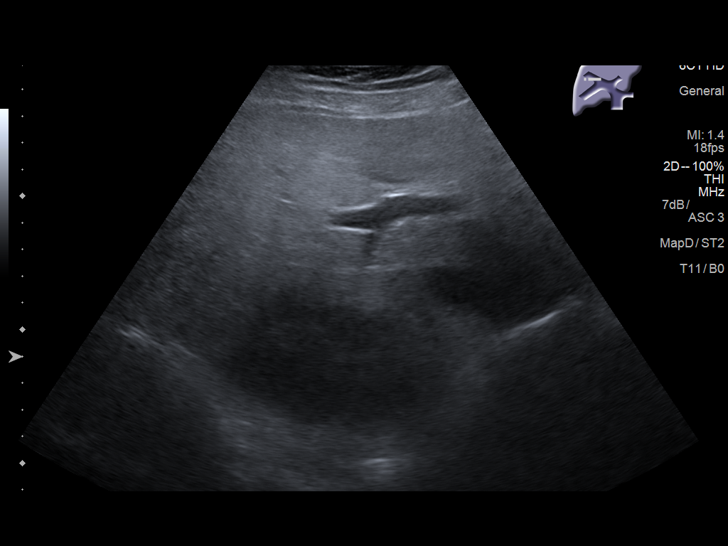
[im 72/72]
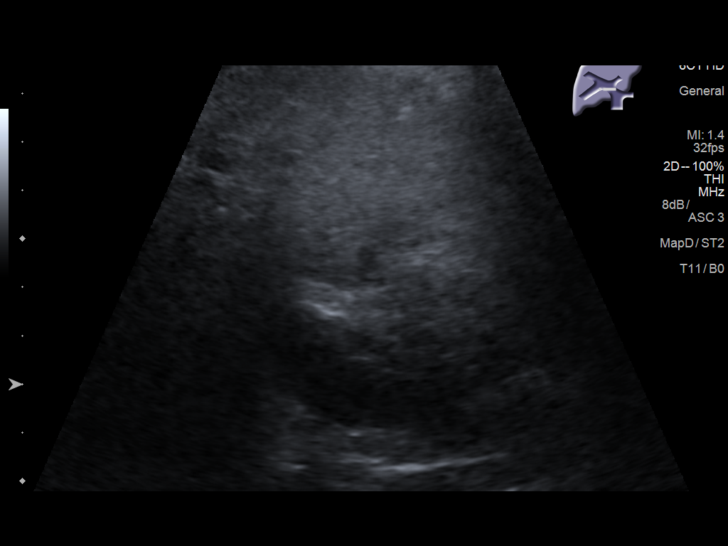

[14 of 25 positions shown; findings below may reference images not displayed]

FINDINGS: Gallbladder:

No gallstones or wall thickening visualized. No sonographic Murphy
sign noted by sonographer.

Common bile duct:

Diameter: 3.6 mm

Liver:

Diffusely echogenic, without significant change. Corresponding low
density on the previous CT. Portal vein is patent on color Doppler
imaging with normal direction of blood flow towards the liver.
IMPRESSION: 1. No acute abnormality.
2. Stable diffuse hepatic steatosis.

## 2023-08-20 DEATH — deceased
